# Patient Record
Sex: Female | Born: 1940 | Race: White | Hispanic: No | Smoking: Former smoker
Health system: Southern US, Community
[De-identification: ages and names within clinical notes are randomized; demographics above are authoritative.]

## PROBLEM LIST (undated history)

## (undated) ENCOUNTER — Emergency Department (HOSPITAL_COMMUNITY): Admission: EM

## (undated) DIAGNOSIS — S62109A Fracture of unspecified carpal bone, unspecified wrist, initial encounter for closed fracture: Secondary | ICD-10-CM

## (undated) DIAGNOSIS — M1990 Unspecified osteoarthritis, unspecified site: Secondary | ICD-10-CM

## (undated) DIAGNOSIS — I10 Essential (primary) hypertension: Secondary | ICD-10-CM

## (undated) DIAGNOSIS — M069 Rheumatoid arthritis, unspecified: Secondary | ICD-10-CM

## (undated) DIAGNOSIS — E785 Hyperlipidemia, unspecified: Secondary | ICD-10-CM

## (undated) DIAGNOSIS — I4891 Unspecified atrial fibrillation: Secondary | ICD-10-CM

## (undated) DIAGNOSIS — F0280 Dementia in other diseases classified elsewhere without behavioral disturbance: Secondary | ICD-10-CM

## (undated) HISTORY — PX: CHOLECYSTECTOMY: SHX55

## (undated) HISTORY — DX: Rheumatoid arthritis, unspecified: M06.9

## (undated) HISTORY — DX: Dementia in other diseases classified elsewhere, unspecified severity, without behavioral disturbance, psychotic disturbance, mood disturbance, and anxiety: F02.80

## (undated) HISTORY — DX: Unspecified osteoarthritis, unspecified site: M19.90

## (undated) HISTORY — DX: Hyperlipidemia, unspecified: E78.5

## (undated) HISTORY — DX: Fracture of unspecified carpal bone, unspecified wrist, initial encounter for closed fracture: S62.109A

## (undated) HISTORY — DX: Unspecified atrial fibrillation: I48.91

## (undated) HISTORY — PX: APPENDECTOMY: SHX54

## (undated) HISTORY — PX: CATARACT EXTRACTION: SUR2

## (undated) HISTORY — PX: HYSTERECTOMY ABDOMINAL WITH SALPINGECTOMY: SHX6725

## (undated) HISTORY — DX: Essential (primary) hypertension: I10

---

## 2018-09-01 ENCOUNTER — Other Ambulatory Visit: Payer: Self-pay

## 2018-09-02 ENCOUNTER — Other Ambulatory Visit: Payer: Self-pay | Admitting: Cardiology

## 2018-11-24 ENCOUNTER — Telehealth: Payer: Self-pay

## 2018-11-24 NOTE — Telephone Encounter (Signed)
Her PCP needs to fill the Rx for 1 month and I will be happy to see the patient in 3 weeks unless having problems then sooner

## 2018-11-24 NOTE — Telephone Encounter (Signed)
I received a phone call from a Physician assistant today doing a virtual visit with this patient . The Pa said the patient os needing refills on her meds,   Patient has not been seen since 2018. She is willing to do a virtual visit, can we send her Metoprolol, spirolactone and simvastatin in and schedule her for Virtual ?  Also, she said she has not taken these meds for over 3 months?

## 2018-12-07 ENCOUNTER — Encounter: Payer: Self-pay | Admitting: Cardiology

## 2018-12-08 ENCOUNTER — Other Ambulatory Visit: Payer: Self-pay

## 2018-12-08 ENCOUNTER — Encounter: Payer: Self-pay | Admitting: Cardiology

## 2018-12-08 ENCOUNTER — Ambulatory Visit (INDEPENDENT_AMBULATORY_CARE_PROVIDER_SITE_OTHER): Payer: Medicare Other | Admitting: Cardiology

## 2018-12-08 VITALS — Ht 65.0 in | Wt 166.0 lb

## 2018-12-08 DIAGNOSIS — I10 Essential (primary) hypertension: Secondary | ICD-10-CM

## 2018-12-08 DIAGNOSIS — I480 Paroxysmal atrial fibrillation: Secondary | ICD-10-CM | POA: Diagnosis not present

## 2018-12-08 NOTE — Progress Notes (Signed)
Virtual Visit via Video Note   Subjective:   Patricia Clark, female    DOB: 1941/04/21, 78 y.o.   MRN: [id number]   I connected with the patient on 12/08/18 by a video enabled telemedicine application and verified that I am speaking with the correct person using two identifiers.     I discussed the limitations of evaluation and management by telemedicine and the availability of in person appointments. The patient expressed understanding and agreed to proceed.   This visit type was conducted due to national recommendations for restrictions regarding the COVID-19 Pandemic (e.g. social distancing).  This format is felt to be most appropriate for this patient at this time.  All issues noted in this document were discussed and addressed.  No physical exam was performed (except for noted visual exam findings with Tele health visits).  The patient has consented to conduct a Tele health visit and understands insurance will be billed.     Chief complaint:  PAF   HPI  78 y.o.  female with hypertension, RA, PAF.  Patient recently moved into a new appt with her grandson, and has not been checking her BP regularly. She denies chest pain, shortness of breath, palpitations, leg edema, orthopnea, PND, TIA/syncope. She recently underwent labwork through Rheum Dr. Nickola Major. She has an upcoming visit with PCP Dr. Abigail Miyamoto next week.    Past Medical History:  Diagnosis Date  . A-fib (HCC)   . Hyperlipidemia   . Hypertension   . Osteoarthritis   . Rheumatoid arthritis [facility])      Past Surgical History:  Procedure Laterality Date  . APPENDECTOMY    . CATARACT EXTRACTION     BOTH EYES  . CHOLECYSTECTOMY    . HYSTERECTOMY ABDOMINAL WITH SALPINGECTOMY       Social History   Socioeconomic History  . Marital status: Divorced    Spouse name: Not on file  . Number of children: 2  . Years of education: Not on file  . Highest education level: Not on file  Occupational History  . Not on file   Social Needs  . Financial resource strain: Not on file  . Food insecurity:    Worry: Not on file    Inability: Not on file  . Transportation needs:    Medical: Not on file    Non-medical: Not on file  Tobacco Use  . Smoking status: Never Smoker  . Smokeless tobacco: Never Used  Substance and Sexual Activity  . Alcohol use: Never    Frequency: Never  . Drug use: Never  . Sexual activity: Not on file  Lifestyle  . Physical activity:    Days per week: Not on file    Minutes per session: Not on file  . Stress: Not on file  Relationships  . Social connections:    Talks on phone: Not on file    Gets together: Not on file    Attends religious service: Not on file    Active member of club or organization: Not on file    Attends meetings of clubs or organizations: Not on file    Relationship status: Not on file  . Intimate partner violence:    Fear of current or ex partner: Not on file    Emotionally abused: Not on file    Physically abused: Not on file    Forced sexual activity: Not on file  Other Topics Concern  . Not on file  Social History Narrative  . Not on file  Family History  Problem Relation Age of Onset  . COPD Mother   . Dementia Mother   . Heart attack Father      Current Outpatient Medications on File Prior to Visit  Medication Sig Dispense Refill  . folic acid (FOLVITE) 1 MG tablet Take 1 mg by mouth daily.    . methotrexate 2.5 MG tablet Take 10 tablets by mouth once a week.    . metoprolol succinate (TOPROL-XL) 50 MG 24 hr tablet Take 50 mg by mouth daily. Take with or immediately following a meal.    . oxyCODONE-acetaminophen (PERCOCET/ROXICET) 5-325 MG tablet Take 1 tablet by mouth every 12 (twelve) hours as needed.    Marland Kitchen POTASSIUM CHLORIDE PO Take by mouth daily.    . pramipexole (MIRAPEX) 0.125 MG tablet Take 0.25 mg by mouth daily.    . rivaroxaban (XARELTO) 20 MG TABS tablet Take 20 mg by mouth daily with supper.    . simvastatin (ZOCOR) 20  MG tablet Take 20 mg by mouth daily.    Marland Kitchen spironolactone (ALDACTONE) 25 MG tablet Take 25 mg by mouth daily.     No current facility-administered medications on file prior to visit.     Cardiovascular studies:  EKG 05/13/2017: Sinus rhythm 75 bpm. Frequent PAC's and atrial couplets.   Echocardiogram 05/18/2017: Left ventricle cavity is normal in size. Mild decrease in global wall motion. Doppler evidence of grade I (impaired) diastolic dysfunction, normal LAP. LVEF 45-50%. Left atrial cavity is mildly dilated. Mild Mitral (Grade I) regurgitation. Mild tricuspid regurgitation. Estimated pulmonary artery systolic pressure 25 mmHg No significant change compared to prior study dated 05/05/2016.  Event monitor 04/14/2017-06/12/2017: Paroxysmal atrial fibrillation.  Maximum heart rate 124 bpm.  Recent labs: Pending   Review of Systems  Constitution: Negative for decreased appetite, malaise/fatigue, weight gain and weight loss.  HENT: Negative for congestion.   Eyes: Negative for visual disturbance.  Cardiovascular: Negative for chest pain, dyspnea on exertion, leg swelling, palpitations and syncope.  Respiratory: Negative for cough.   Endocrine: Negative for cold intolerance.  Hematologic/Lymphatic: Does not bruise/bleed easily.  Skin: Negative for itching and rash.  Musculoskeletal: Negative for myalgias.  Gastrointestinal: Negative for abdominal pain, nausea and vomiting.  Genitourinary: Negative for dysuria.  Neurological: Negative for dizziness and weakness.  Psychiatric/Behavioral: The patient is not nervous/anxious.   All other systems reviewed and are negative.      Vitals not available.   Observation/findings during video visit   Objective:    Physical Exam  Constitutional: She is oriented to person, place, and time. She appears well-developed and well-nourished. No distress.  Pulmonary/Chest: Effort normal.  Neurological: She is alert and oriented to person,  place, and time.  Psychiatric: She has a normal mood and affect.  Nursing note and vitals reviewed.         Assessment & Recommendations:   78 y.o.  female with hypertension, RA, PAF.  PAF: CHA2DS2VASc score 4. Annual stroke risk 4%. Continue Xarelto 20 mg daily. Will obtain recent labs from Rheumatology office. Need to ensure GFR is still >50.  Hypertension: Needs follow up BP check. Has visit with PCP next week.  I will see her in 3 months   Keya Wynes Emiliano Dyer, MD [facility] [place] Pager: [phone] Office: [phone] If no answer Cell [phone]

## 2018-12-27 ENCOUNTER — Telehealth: Payer: Self-pay | Admitting: Cardiology

## 2018-12-27 NOTE — Telephone Encounter (Signed)
Patient called saying you wanted to know her BP reading. It is 126/87.

## 2018-12-28 NOTE — Telephone Encounter (Signed)
BP looks good. No further labs needed. Please ask if she needs refills.  Thanks MJP

## 2018-12-30 ENCOUNTER — Other Ambulatory Visit: Payer: Self-pay | Admitting: Cardiology

## 2018-12-30 DIAGNOSIS — E782 Mixed hyperlipidemia: Secondary | ICD-10-CM

## 2018-12-30 DIAGNOSIS — I10 Essential (primary) hypertension: Secondary | ICD-10-CM

## 2018-12-30 MED ORDER — SIMVASTATIN 20 MG PO TABS: 20.0000 mg | ORAL_TABLET | Freq: Every day | ORAL | 3 refills | Status: DC

## 2018-12-30 MED ORDER — SPIRONOLACTONE 25 MG PO TABS: 25.0000 mg | ORAL_TABLET | Freq: Every day | ORAL | 3 refills | Status: DC

## 2018-12-30 MED ORDER — METOPROLOL SUCCINATE ER 50 MG PO TB24: 50.0000 mg | ORAL_TABLET | Freq: Every day | ORAL | 3 refills | Status: DC

## 2019-01-27 ENCOUNTER — Other Ambulatory Visit: Payer: Self-pay

## 2019-01-27 MED ORDER — RIVAROXABAN 20 MG PO TABS: 20.0000 mg | ORAL_TABLET | Freq: Every day | ORAL | 0 refills | Status: DC

## 2019-03-08 ENCOUNTER — Encounter: Payer: Self-pay | Admitting: Cardiology

## 2019-03-09 ENCOUNTER — Ambulatory Visit: Payer: Medicare Other | Admitting: Cardiology

## 2019-03-26 NOTE — Progress Notes (Signed)
No show

## 2019-03-27 ENCOUNTER — Ambulatory Visit: Payer: Medicare Other | Admitting: Cardiology

## 2019-05-17 ENCOUNTER — Other Ambulatory Visit: Payer: Self-pay

## 2019-05-17 MED ORDER — RIVAROXABAN 20 MG PO TABS: 20.0000 mg | ORAL_TABLET | Freq: Every day | ORAL | 1 refills | Status: DC

## 2019-06-25 NOTE — Progress Notes (Signed)
Subjective:   Patricia Clark, female    DOB: 1940-08-23, 78 y.o.   MRN: [id number]   Chief complaint:  PAF   HPI  78 y.o.  female with hypertension, RA, PAF.  Patient reports being in a lot of pain owing to her rheumatoid arthritis. She has no cardiac complaints today. She denies any bleeding.   Past Medical History:  Diagnosis Date  . A-fib ([place])   . Hyperlipidemia   . Hypertension   . Osteoarthritis   . Rheumatoid arthritis [facility])      Past Surgical History:  Procedure Laterality Date  . APPENDECTOMY    . CATARACT EXTRACTION     BOTH EYES  . CHOLECYSTECTOMY    . HYSTERECTOMY ABDOMINAL WITH SALPINGECTOMY       Social History   Socioeconomic History  . Marital status: Divorced    Spouse name: Not on file  . Number of children: 2  . Years of education: Not on file  . Highest education level: Not on file  Occupational History  . Not on file  Tobacco Use  . Smoking status: Former Smoker    Packs/day: 0.25    Years: 6.00    Pack years: 1.50    Quit date: 12/08/1998    Years since quitting: 20.5  . Smokeless tobacco: Never Used  Substance and Sexual Activity  . Alcohol use: Yes    Comment: occ  . Drug use: Never  . Sexual activity: Not on file  Other Topics Concern  . Not on file  Social History Narrative  . Not on file   Social Determinants of Health   Financial Resource Strain:   . Difficulty of Paying Living Expenses: Not on file  Food Insecurity:   . Worried About Charity fundraiser in the Last Year: Not on file  . Ran Out of Food in the Last Year: Not on file  Transportation Needs:   . Lack of Transportation (Medical): Not on file  . Lack of Transportation (Non-Medical): Not on file  Physical Activity:   . Days of Exercise per Week: Not on file  . Minutes of Exercise per Session: Not on file  Stress:   . Feeling of Stress : Not on file  Social Connections:   . Frequency of Communication with Friends and Family: Not on file  . Frequency of  Social Gatherings with Friends and Family: Not on file  . Attends Religious Services: Not on file  . Active Member of Clubs or Organizations: Not on file  . Attends Archivist Meetings: Not on file  . Marital Status: Not on file  Intimate Partner Violence:   . Fear of Current or Ex-Partner: Not on file  . Emotionally Abused: Not on file  . Physically Abused: Not on file  . Sexually Abused: Not on file     Family History  Problem Relation Age of Onset  . COPD Mother   . Dementia Mother   . Heart attack Father   . Heart disease Sister   . Heart disease Sister      Current Outpatient Medications on File Prior to Visit  Medication Sig Dispense Refill  . folic acid (FOLVITE) 1 MG tablet Take 1 mg by mouth daily.    . methotrexate 2.5 MG tablet Take 10 tablets by mouth once a week.    . metoprolol succinate (TOPROL-XL) 50 MG 24 hr tablet Take 1 tablet (50 mg total) by mouth daily. Take with or immediately following  a meal. 90 tablet 3  . oxyCODONE-acetaminophen (PERCOCET/ROXICET) 5-325 MG tablet Take 1 tablet by mouth every 12 (twelve) hours as needed.    . Potassium 99 MG TABS Take 99 mg by mouth daily.    . pramipexole (MIRAPEX) 0.125 MG tablet Take 0.125 mg by mouth 2 (two) times daily.     . rivaroxaban (XARELTO) 20 MG TABS tablet Take 1 tablet (20 mg total) by mouth daily with supper. 90 tablet 1  . simvastatin (ZOCOR) 20 MG tablet Take 1 tablet (20 mg total) by mouth daily. 90 tablet 3  . spironolactone (ALDACTONE) 25 MG tablet Take 1 tablet (25 mg total) by mouth daily. 90 tablet 3   No current facility-administered medications on file prior to visit.    Cardiovascular studies:  Sinus rhythm 63 bpm. Occasional PAC. Nonspecific ST-T changes.  EKG 05/13/2017: Sinus rhythm 75 bpm. Frequent PAC's and atrial couplets.   Echocardiogram 05/18/2017: Left ventricle cavity is normal in size. Mild decrease in global wall motion. Doppler evidence of grade I (impaired)  diastolic dysfunction, normal LAP. LVEF 45-50%. Left atrial cavity is mildly dilated. Mild Mitral (Grade I) regurgitation. Mild tricuspid regurgitation. Estimated pulmonary artery systolic pressure 25 mmHg No significant change compared to prior study dated 05/05/2016.  Event monitor 04/14/2017-06/12/2017: Paroxysmal atrial fibrillation.  Maximum heart rate 124 bpm.  Recent labs: 11/03/2018: Glucose 88, BUN/Cr 11/0.62. EGFR normal. Na/K 141/3.9. Rest of the CMP normal H/H 11.2/32.3. MCV 93. Platelets 424    Review of Systems  Constitution: Negative for decreased appetite, malaise/fatigue, weight gain and weight loss.  HENT: Negative for congestion.   Eyes: Negative for visual disturbance.  Cardiovascular: Negative for chest pain, dyspnea on exertion, leg swelling, palpitations and syncope.  Respiratory: Negative for cough.   Endocrine: Negative for cold intolerance.  Hematologic/Lymphatic: Does not bruise/bleed easily.  Skin: Negative for itching and rash.  Musculoskeletal: Negative for myalgias.  Gastrointestinal: Negative for abdominal pain, nausea and vomiting.  Genitourinary: Negative for dysuria.  Neurological: Negative for dizziness and weakness.  Psychiatric/Behavioral: The patient is not nervous/anxious.   All other systems reviewed and are negative.      Vitals:   06/26/19 1440  BP: (!) 147/80  Pulse: 60  SpO2: 99%     Objective:    Physical Exam  Constitutional: She is oriented to person, place, and time. She appears well-developed and well-nourished. No distress.  Pulmonary/Chest: Effort normal.  Neurological: She is alert and oriented to person, place, and time.  Psychiatric: She has a normal mood and affect.  Nursing note and vitals reviewed.         Assessment & Recommendations:   78 y.o.  female with hypertension, RA, PAF.  PAF: CHA2DS2VASc score 4. Annual stroke risk 4%. Continue Xarelto 20 mg daily.  Hypertension: Mildly elevated .  Continue f/u w/PCP.   F/u in 1 year.  Nigel Mormon, MD [facility]. [place] Pager: [phone] Office: [phone] If no answer Cell [phone]

## 2019-06-26 ENCOUNTER — Other Ambulatory Visit: Payer: Self-pay

## 2019-06-26 ENCOUNTER — Encounter: Payer: Self-pay | Admitting: Cardiology

## 2019-06-26 ENCOUNTER — Ambulatory Visit: Payer: Medicare Other | Admitting: Cardiology

## 2019-06-26 VITALS — BP 147/80 | HR 60 | Ht 64.0 in | Wt 173.1 lb

## 2019-06-26 DIAGNOSIS — I480 Paroxysmal atrial fibrillation: Secondary | ICD-10-CM | POA: Diagnosis not present

## 2019-06-26 DIAGNOSIS — I10 Essential (primary) hypertension: Secondary | ICD-10-CM | POA: Diagnosis not present

## 2019-11-22 ENCOUNTER — Other Ambulatory Visit: Payer: Self-pay | Admitting: Cardiology

## 2019-12-20 ENCOUNTER — Other Ambulatory Visit: Payer: Self-pay

## 2019-12-20 DIAGNOSIS — E782 Mixed hyperlipidemia: Secondary | ICD-10-CM

## 2019-12-20 DIAGNOSIS — I10 Essential (primary) hypertension: Secondary | ICD-10-CM

## 2019-12-20 MED ORDER — METOPROLOL SUCCINATE ER 50 MG PO TB24: 50.0000 mg | ORAL_TABLET | Freq: Every day | ORAL | 3 refills | Status: DC

## 2019-12-20 MED ORDER — SIMVASTATIN 20 MG PO TABS: 20.0000 mg | ORAL_TABLET | Freq: Every day | ORAL | 3 refills | Status: DC

## 2019-12-20 MED ORDER — SPIRONOLACTONE 25 MG PO TABS: 25.0000 mg | ORAL_TABLET | Freq: Every day | ORAL | 3 refills | Status: DC

## 2020-02-16 ENCOUNTER — Other Ambulatory Visit: Payer: Self-pay | Admitting: Family Medicine

## 2020-02-16 DIAGNOSIS — E2839 Other primary ovarian failure: Secondary | ICD-10-CM

## 2020-04-12 ENCOUNTER — Encounter (HOSPITAL_COMMUNITY): Payer: Self-pay | Admitting: Emergency Medicine

## 2020-04-12 ENCOUNTER — Emergency Department (HOSPITAL_COMMUNITY)
Admission: EM | Admit: 2020-04-12 | Discharge: 2020-04-12 | Disposition: A | Discharge status: Home / Self Care | Payer: Medicare Other | Attending: Emergency Medicine | Admitting: Emergency Medicine

## 2020-04-12 ENCOUNTER — Other Ambulatory Visit: Payer: Self-pay

## 2020-04-12 ENCOUNTER — Emergency Department (HOSPITAL_COMMUNITY): Payer: Medicare Other

## 2020-04-12 DIAGNOSIS — I10 Essential (primary) hypertension: Secondary | ICD-10-CM | POA: Diagnosis not present

## 2020-04-12 DIAGNOSIS — Z79899 Other long term (current) drug therapy: Secondary | ICD-10-CM | POA: Diagnosis not present

## 2020-04-12 DIAGNOSIS — F05 Delirium due to known physiological condition: Secondary | ICD-10-CM | POA: Diagnosis not present

## 2020-04-12 DIAGNOSIS — N390 Urinary tract infection, site not specified: Secondary | ICD-10-CM | POA: Diagnosis not present

## 2020-04-12 DIAGNOSIS — Z7901 Long term (current) use of anticoagulants: Secondary | ICD-10-CM | POA: Insufficient documentation

## 2020-04-12 DIAGNOSIS — R4182 Altered mental status, unspecified: Secondary | ICD-10-CM | POA: Diagnosis present

## 2020-04-12 DIAGNOSIS — Z87891 Personal history of nicotine dependence: Secondary | ICD-10-CM | POA: Diagnosis not present

## 2020-04-12 DIAGNOSIS — R410 Disorientation, unspecified: Secondary | ICD-10-CM

## 2020-04-12 LAB — CBC WITH DIFFERENTIAL/PLATELET
Abs Immature Granulocytes: 0.03 10*3/uL (ref 0.00–0.07)
Basophils Absolute: 0.1 10*3/uL (ref 0.0–0.1)
Basophils Relative: 2 %
Eosinophils Absolute: 0.6 10*3/uL — ABNORMAL HIGH (ref 0.0–0.5)
Eosinophils Relative: 10 %
HCT: 36.3 % (ref 36.0–46.0)
Hemoglobin: 11.5 g/dL — ABNORMAL LOW (ref 12.0–15.0)
Immature Granulocytes: 1 %
Lymphocytes Relative: 27 %
Lymphs Abs: 1.7 10*3/uL (ref 0.7–4.0)
MCH: 31.9 pg (ref 26.0–34.0)
MCHC: 31.7 g/dL (ref 30.0–36.0)
MCV: 100.6 fL — ABNORMAL HIGH (ref 80.0–100.0)
Monocytes Absolute: 0.6 10*3/uL (ref 0.1–1.0)
Monocytes Relative: 10 %
Neutro Abs: 3.1 10*3/uL (ref 1.7–7.7)
Neutrophils Relative %: 50 %
Platelets: 437 10*3/uL — ABNORMAL HIGH (ref 150–400)
RBC: 3.61 MIL/uL — ABNORMAL LOW (ref 3.87–5.11)
RDW: 16.2 % — ABNORMAL HIGH (ref 11.5–15.5)
WBC: 6.1 10*3/uL (ref 4.0–10.5)
nRBC: 0 % (ref 0.0–0.2)

## 2020-04-12 LAB — URINALYSIS, ROUTINE W REFLEX MICROSCOPIC
Bilirubin Urine: NEGATIVE
Glucose, UA: NEGATIVE mg/dL
Ketones, ur: NEGATIVE mg/dL
Nitrite: POSITIVE — AB
Protein, ur: NEGATIVE mg/dL
Specific Gravity, Urine: 1.008 (ref 1.005–1.030)
pH: 7 (ref 5.0–8.0)

## 2020-04-12 LAB — COMPREHENSIVE METABOLIC PANEL
ALT: 11 U/L (ref 0–44)
AST: 21 U/L (ref 15–41)
Albumin: 4 g/dL (ref 3.5–5.0)
Alkaline Phosphatase: 91 U/L (ref 38–126)
Anion gap: 11 (ref 5–15)
BUN: 5 mg/dL — ABNORMAL LOW (ref 8–23)
CO2: 25 mmol/L (ref 22–32)
Calcium: 9.7 mg/dL (ref 8.9–10.3)
Chloride: 103 mmol/L (ref 98–111)
Creatinine, Ser: 0.64 mg/dL (ref 0.44–1.00)
GFR, Estimated: >60 mL/min (ref >60)
Glucose, Bld: 133 mg/dL — ABNORMAL HIGH (ref 70–99)
Potassium: 3.2 mmol/L — ABNORMAL LOW (ref 3.5–5.1)
Sodium: 139 mmol/L (ref 135–145)
Total Bilirubin: 0.2 mg/dL — ABNORMAL LOW (ref 0.3–1.2)
Total Protein: 7.2 g/dL (ref 6.5–8.1)

## 2020-04-12 LAB — CBG MONITORING, ED: Glucose-Capillary: 160 mg/dL — ABNORMAL HIGH (ref 70–99)

## 2020-04-12 LAB — TROPONIN I (HIGH SENSITIVITY)
Troponin I (High Sensitivity): 14 ng/L (ref <18)
Troponin I (High Sensitivity): 15 ng/L (ref <18)

## 2020-04-12 MED ORDER — SPIRONOLACTONE 25 MG PO TABS: 25.0000 mg | ORAL_TABLET | Freq: Every day | ORAL | Status: DC

## 2020-04-12 MED ORDER — METOPROLOL SUCCINATE ER 25 MG PO TB24
50.0000 mg | ORAL_TABLET | Freq: Every day | ORAL | Status: DC
  Administered 2020-04-12: 50 mg via ORAL
  Filled 2020-04-12: qty 2

## 2020-04-12 MED ORDER — SODIUM CHLORIDE 0.9 % IV SOLN
1.0000 g | Freq: Once | INTRAVENOUS | Status: AC
  Administered 2020-04-12: 1 g via INTRAVENOUS
  Filled 2020-04-12: qty 10

## 2020-04-12 MED ORDER — POTASSIUM CHLORIDE CRYS ER 20 MEQ PO TBCR
40.0000 meq | EXTENDED_RELEASE_TABLET | Freq: Once | ORAL | Status: AC
  Administered 2020-04-12: 40 meq via ORAL
  Filled 2020-04-12: qty 2

## 2020-04-12 MED ORDER — PENICILLIN V POTASSIUM 500 MG PO TABS: 500.0000 mg | ORAL_TABLET | Freq: Three times a day (TID) | ORAL | 0 refills | Status: DC

## 2020-04-12 NOTE — ED Triage Notes (Signed)
Pt states she is having some confusion for the past week and she wants to be check. She states that family reported her that she is walking at night and wandering around in the rain and doing thinks that she don't remember, pt is AO x 4 during triage, denies any pain, some tremors noticed in her hand, but no neuro deficit noticed.

## 2020-04-12 NOTE — ED Provider Notes (Signed)
[facility] EMERGENCY DEPARTMENT Provider Note   CSN: [id number] Arrival date & time: 04/12/20  1329     History Chief Complaint  Patient presents with  . Altered Mental Status    Patricia Clark is a 79 y.o. female.  The history is provided by the patient, a relative and medical records. No language interpreter was used.  Altered Mental Status    79 year old female significant history of atrial fibrillation currently on Xarelto, hypertension, hyperlipidemia presenting to the ED from home for evaluation of altered mental status.  Patient states family member that she lives with which includes her grandson and granddaughter who voiced concern that she has been more confused within the past week.  One example is patient was found living in the house at nighttime in the rain that she did not recall.  She also is more forgetful within the past few days.  Patient states she felt fine and does not think that she is having any memory problem.  She is here at the recommendation of her family member who apparently have reached out to her PCP who recommend patient to come here for "a full checkup".  Patient does not have any specific complaint aside from some mild congestion for the past few days and occasional cough.  She is fully vaccinated for COVID-19.  She denies any recent medication changes, denies feeling depressed, denies alcohol or drug use and denies any change in her diet and sleeping habit.  She is able to perform her daily functional activity and able to ambulate herself.  She mentioned having history of low potassium in the past.  She denies any history of dementia.  Additional history obtained through granddaughter who is now at bedside.  Granddaughter reported patient is apparently more confused than usual and would like to have her checkup.  Patient denies any infectious symptoms including no urinary symptoms.  No chest pain or shortness of breath.  Past Medical History:    Diagnosis Date  . A-fib (HCC)   . Hyperlipidemia   . Hypertension   . Osteoarthritis   . Rheumatoid arthritis [facility])     Patient Active Problem List   Diagnosis Date Noted  . Paroxysmal atrial fibrillation (HCC) 12/08/2018  . Essential hypertension 12/08/2018    Past Surgical History:  Procedure Laterality Date  . APPENDECTOMY    . CATARACT EXTRACTION     BOTH EYES  . CHOLECYSTECTOMY    . HYSTERECTOMY ABDOMINAL WITH SALPINGECTOMY       OB History   No obstetric history on file.     Family History  Problem Relation Age of Onset  . COPD Mother   . Dementia Mother   . Heart attack Father   . Heart disease Sister   . Heart disease Sister     Social History   Tobacco Use  . Smoking status: Former Smoker    Packs/day: 0.25    Years: 6.00    Pack years: 1.50    Quit date: 12/08/1998    Years since quitting: 21.3  . Smokeless tobacco: Never Used  Vaping Use  . Vaping Use: Never used  Substance Use Topics  . Alcohol use: Yes    Comment: occ  . Drug use: Never    Home Medications Prior to Admission medications   Medication Sig Start Date End Date Taking? Authorizing Provider  folic acid (FOLVITE) 1 MG tablet Take 1 mg by mouth daily.    [provider]  methotrexate 2.5 MG  tablet Take 10 tablets by mouth once a week.    [provider]  metoprolol succinate (TOPROL-XL) 50 MG 24 hr tablet Take 1 tablet (50 mg total) by mouth daily. Take with or immediately following a meal. 12/20/19   Patwardhan, Manish J, MD  oxyCODONE-acetaminophen (PERCOCET/ROXICET) 5-325 MG tablet Take 1 tablet by mouth every 12 (twelve) hours as needed. 08/03/18   [provider]  Potassium 99 MG TABS Take 99 mg by mouth daily.    [provider]  pramipexole (MIRAPEX) 0.125 MG tablet Take 0.125 mg by mouth 2 (two) times daily.  08/04/18   [provider]  simvastatin (ZOCOR) 20 MG tablet Take 1 tablet (20 mg total) by mouth daily. 12/20/19    Patwardhan, Anabel Bene, MD  spironolactone (ALDACTONE) 25 MG tablet Take 1 tablet (25 mg total) by mouth daily. 12/20/19   Patwardhan, Manish J, MD  XARELTO 20 MG TABS tablet TAKE 1 TABLET (20 MG TOTAL) BY MOUTH DAILY WITH SUPPER. 11/22/19   Yates Decamp, MD    Allergies    Patient has no known allergies.  Review of Systems   Review of Systems  All other systems reviewed and are negative.   Physical Exam Updated Vital Signs BP (!) 153/81   Pulse 66   Temp 98.2 F (36.8 C)   Resp 15   Ht 5\' 4"  (1.626 m)   Wt 78.5 kg   SpO2 98%   BMI 29.71 kg/m   Physical Exam Vitals and nursing note reviewed.  Constitutional:      General: She is not in acute distress.    Appearance: She is well-developed.  HENT:     Head: Atraumatic.  Eyes:     Conjunctiva/sclera: Conjunctivae normal.  Cardiovascular:     Rate and Rhythm: Normal rate. Rhythm irregular.     Pulses: Normal pulses.     Heart sounds: Normal heart sounds.  Pulmonary:     Effort: Pulmonary effort is normal.     Breath sounds: No wheezing, rhonchi or rales.  Abdominal:     Palpations: Abdomen is soft.     Tenderness: There is no abdominal tenderness.  Musculoskeletal:     Cervical back: Neck supple.  Skin:    Findings: No rash.  Neurological:     Mental Status: She is alert and oriented to person, place, and time.     GCS: GCS eye subscore is 4. GCS verbal subscore is 5. GCS motor subscore is 6.     Cranial Nerves: Cranial nerves are intact.     Sensory: Sensation is intact.     Motor: Motor function is intact.     Comments: Normal neuro status.  Normal mentation.  Able to perform 3 words recall after 10 minutes.  Psychiatric:        Mood and Affect: Mood normal.        Speech: Speech normal.        Behavior: Behavior is cooperative.        Cognition and Memory: Cognition and memory normal.     ED Results / Procedures / Treatments   Labs (all labs ordered are listed, but only abnormal results are displayed) Labs  Reviewed  CBC WITH DIFFERENTIAL/PLATELET - Abnormal; Notable for the following components:      Result Value   RBC 3.61 (*)    Hemoglobin 11.5 (*)    MCV 100.6 (*)    RDW 16.2 (*)    Platelets 437 (*)  Eosinophils Absolute 0.6 (*)    All other components within normal limits  COMPREHENSIVE METABOLIC PANEL - Abnormal; Notable for the following components:   Potassium 3.2 (*)    Glucose, Bld 133 (*)    BUN 5 (*)    Total Bilirubin 0.2 (*)    All other components within normal limits  URINALYSIS, ROUTINE W REFLEX MICROSCOPIC - Abnormal; Notable for the following components:   APPearance HAZY (*)    Hgb urine dipstick SMALL (*)    Nitrite POSITIVE (*)    Leukocytes,Ua LARGE (*)    Bacteria, UA RARE (*)    All other components within normal limits  CBG MONITORING, ED - Abnormal; Notable for the following components:   Glucose-Capillary 160 (*)    All other components within normal limits  URINE CULTURE  TROPONIN I (HIGH SENSITIVITY)  TROPONIN I (HIGH SENSITIVITY)    EKG EKG Interpretation  Date/Time:  Friday April 12 2020 13:39:42 EDT Ventricular Rate:  75 PR Interval:    QRS Duration: 90 QT Interval:  444 QTC Calculation: 495 R Axis:   20 Text Interpretation: Sinus rhythm with Blocked Premature atrial complexes Nonspecific ST and T wave abnormality Prolonged QT Abnormal ECG Confirmed by Marianna Fuss ([id number]) on 04/12/2020 4:43:32 PM   Radiology CT Head Wo Contrast  Result Date: 04/12/2020 CLINICAL DATA:  Confusion EXAM: CT HEAD WITHOUT CONTRAST TECHNIQUE: Contiguous axial images were obtained from the base of the skull through the vertex without intravenous contrast. COMPARISON:  None. FINDINGS: Brain: No evidence of acute infarction, hemorrhage, hydrocephalus, extra-axial collection or mass lesion/mass effect. Mild atrophic changes are noted. Vascular: No hyperdense vessel or unexpected calcification. Skull: Normal. Negative for fracture or focal lesion.  Sinuses/Orbits: No acute finding. Other: None. IMPRESSION: No acute intracranial abnormality noted. Mild atrophic changes are seen. Electronically Signed   By: Alcide Clever M.D.   On: 04/12/2020 19:13    Procedures Procedures (including critical care time)  Medications Ordered in ED Medications  metoprolol succinate (TOPROL-XL) 24 hr tablet 50 mg (has no administration in time range)  spironolactone (ALDACTONE) tablet 25 mg (has no administration in time range)  cefTRIAXone (ROCEPHIN) 1 g in sodium chloride 0.9 % 100 mL IVPB (has no administration in time range)  potassium chloride SA (KLOR-CON) CR tablet 40 mEq (40 mEq Oral Given 04/12/20 1753)    ED Course  I have reviewed the triage vital signs and the nursing notes.  Pertinent labs & imaging results that were available during my care of the patient were reviewed by me and considered in my medical decision making (see chart for details).    MDM Rules/Calculators/A&P                          BP (!) 149/102   Pulse 78   Temp 98.2 F (36.8 C)   Resp (!) 21   Ht 5\' 4"  (1.626 m)   Wt 78.5 kg   SpO2 100%   BMI 29.71 kg/m   Final Clinical Impression(s) / ED Diagnoses Final diagnoses:  Lower urinary tract infectious disease  Delirium    Rx / DC Orders ED Discharge Orders         Ordered    penicillin v potassium (VEETID) 500 MG tablet  3 times daily        04/12/20 1946         5:08 PM Patient brought here with concerns of increased confusion within the past week.  Patient states she felt fine. family voice concern that she is more confused than usual.  On exam she has no focal neuro deficit, mentating appropriately, is alert and oriented x3 and able to recall 3 words after 10 minutes.  Aside from mild congestion and occasional cough patient without any infectious symptoms.  Will check UA.  Work-up fairly unremarkable.  Mild hypokalemia with a potassium of 3.2, supplementation given.  Care discussed with Dr. Stevie Kern.     6:17 PM UA came back with finding concerning for UTI.  Therefore, will give Rocephin, urine culture sent.  7:45 PM At this time, patient is stable for discharge.  She can take p.o. medication for UTI.  She will follow-up with PCP for further care.  Return precaution discussed.  Patient and family member agrees with plan.   Fayrene Helper, PA-C 04/12/20 1947    Milagros Loll, MD 04/13/20 [phone]

## 2020-04-12 NOTE — Discharge Instructions (Signed)
Your confusion is likely due to an underlying urinary tract infection.  Please take antibiotic as prescribed and follow-up with your doctor for further care.  Return if you have any concern.

## 2020-04-15 LAB — URINE CULTURE: Culture: >=100000 — AB

## 2020-04-16 ENCOUNTER — Telehealth: Payer: Self-pay | Admitting: *Deleted

## 2020-04-16 NOTE — Telephone Encounter (Signed)
Post ED Visit - Positive Culture Follow-up  Culture report reviewed by antimicrobial stewardship pharmacist: [facility] []  , Pharm.D. []  Enzo Bi, Pharm.D., BCPS AQ-ID []  , Pharm.D., BCPS []  Celedonio Miyamoto, Pharm.D., BCPS []  [place], Garvin Fila.D., BCPS, AAHIVP []  , Pharm.D., BCPS, AAHIVP []  Georgina Pillion, PharmD, BCPS []  , PharmD, BCPS []  [place], PharmD, BCPS []  [place], PharmD []  , PharmD, BCPS []  Estella Husk, PharmD  Pharmacy Team []  Lysle Pearl, PharmD []  , PharmD []  Phillips Climes, PharmD []  , Rph []  Agapito Games) , PharmD []  Verlan Friends, PharmD []  , PharmD []  Mervyn Gay, PharmD []  , PharmD []  Vinnie Level, PharmD []  [facility], PharmD []  , PharmD []  Len Childs, PharmD   Positive urine culture Treated with Penicillin V Potassium, organism sensitive to the same and no further patient follow-up is required at this time. , PharmD  Greer Pickerel Talley 04/16/2020, 11:26 AM

## 2020-04-16 NOTE — Progress Notes (Signed)
ED Antimicrobial Stewardship Positive Culture Follow Up   Patricia Clark is an 79 y.o. female who presented to [facility] on 04/12/2020 with a chief complaint of altered mental status  Chief Complaint  Patient presents with  . Altered Mental Status    Recent Results (from the past 720 hour(s))  Urine culture     Status: Abnormal   Collection Time: 04/12/20  5:56 PM   Specimen: Urine, Random  Result Value Ref Range Status   Specimen Description URINE, RANDOM  Final   Special Requests   Final    NONE Performed at [facility], [address] [place], [place] [address]    Culture >=100,000 COLONIES/mL ESCHERICHIA COLI (A)  Final   Report Status 04/15/2020 FINAL  Final   Organism ID, Bacteria ESCHERICHIA COLI (A)  Final      Susceptibility   Escherichia coli - MIC*    AMPICILLIN 8 SENSITIVE Sensitive     CEFAZOLIN <=4 SENSITIVE Sensitive     CEFTRIAXONE <=0.25 SENSITIVE Sensitive     CIPROFLOXACIN <=0.25 SENSITIVE Sensitive     GENTAMICIN <=1 SENSITIVE Sensitive     IMIPENEM <=0.25 SENSITIVE Sensitive     NITROFURANTOIN <=16 SENSITIVE Sensitive     TRIMETH/SULFA <=20 SENSITIVE Sensitive     AMPICILLIN/SULBACTAM <=2 SENSITIVE Sensitive     PIP/TAZO <=4 SENSITIVE Sensitive     * >=100,000 COLONIES/mL ESCHERICHIA COLI   AMS appeared to have resolved while in ED and patient denied urinary symptoms - likely asymptomatic bacteriuria and recommend no antibiotics.  MD in agreement that penicillin is not frequently used to treat UTI, however would like to continue course of therapy since patient has shown improvement.  ED Provider: Alvester Chou, MD  Rexford Maus, PharmD PGY-1 Acute Care Pharmacy Resident 04/16/2020 9:40 AM Monday - Friday phone -  [phone] Saturday - Sunday phone - [phone]

## 2020-04-24 ENCOUNTER — Other Ambulatory Visit: Payer: Self-pay

## 2020-04-24 MED ORDER — RIVAROXABAN 20 MG PO TABS: ORAL_TABLET | ORAL | 1 refills | Status: DC

## 2020-05-18 ENCOUNTER — Other Ambulatory Visit: Payer: Self-pay | Admitting: Cardiology

## 2020-05-22 ENCOUNTER — Ambulatory Visit
Admission: RE | Admit: 2020-05-22 | Discharge: 2020-05-22 | Disposition: A | Discharge status: Home / Self Care | Payer: Medicare Other | Source: Ambulatory Visit | Attending: Family Medicine | Admitting: Family Medicine

## 2020-05-22 ENCOUNTER — Other Ambulatory Visit: Payer: Self-pay

## 2020-05-22 DIAGNOSIS — E2839 Other primary ovarian failure: Secondary | ICD-10-CM

## 2020-06-21 ENCOUNTER — Ambulatory Visit: Payer: Medicare Other | Admitting: Cardiology

## 2020-06-24 ENCOUNTER — Other Ambulatory Visit: Payer: Self-pay | Admitting: Cardiology

## 2020-06-24 DIAGNOSIS — E782 Mixed hyperlipidemia: Secondary | ICD-10-CM

## 2020-06-24 DIAGNOSIS — I10 Essential (primary) hypertension: Secondary | ICD-10-CM

## 2020-07-17 ENCOUNTER — Other Ambulatory Visit: Payer: Self-pay

## 2020-07-17 ENCOUNTER — Ambulatory Visit: Payer: Medicare Other | Admitting: Cardiology

## 2020-07-17 ENCOUNTER — Encounter: Payer: Self-pay | Admitting: Cardiology

## 2020-07-17 VITALS — BP 138/87 | HR 68 | Ht 64.0 in | Wt 173.0 lb

## 2020-07-17 DIAGNOSIS — I10 Essential (primary) hypertension: Secondary | ICD-10-CM | POA: Diagnosis not present

## 2020-07-17 DIAGNOSIS — I480 Paroxysmal atrial fibrillation: Secondary | ICD-10-CM

## 2020-07-17 MED ORDER — POTASSIUM 99 MG PO TABS
99.0000 mg | ORAL_TABLET | Freq: Every day | ORAL | 0 refills | Status: DC
Start: 2020-07-17 — End: 2023-09-16

## 2020-07-17 NOTE — Progress Notes (Signed)
Subjective:   Patricia Clark, female    DOB: [birth year], 80 y.o.   MRN: [id number]   Chief complaint:  PAF   HPI  80 y.o.  female with hypertension, RA, PAF.  Patient had a mechanical fall leading to wrist fracture in 2022. She denies chest pain, shortness of breath, palpitations, leg edema, orthopnea, PND, TIA/syncope.   Current Outpatient Medications on File Prior to Visit  Medication Sig Dispense Refill  . folic acid (FOLVITE) 1 MG tablet Take 1 mg by mouth daily.    . methotrexate 2.5 MG tablet Take 10 tablets by mouth once a week.    . metoprolol succinate (TOPROL-XL) 50 MG 24 hr tablet TAKE 1 TABLET (50 MG TOTAL) BY MOUTH DAILY. TAKE WITH OR IMMEDIATELY FOLLOWING A MEAL. 90 tablet 3  . oxyCODONE-acetaminophen (PERCOCET/ROXICET) 5-325 MG tablet Take 1 tablet by mouth every 12 (twelve) hours as needed.    . penicillin v potassium (VEETID) 500 MG tablet Take 1 tablet (500 mg total) by mouth 3 (three) times daily. 30 tablet 0  . Potassium 99 MG TABS Take 99 mg by mouth daily.    . pramipexole (MIRAPEX) 0.125 MG tablet Take 0.125 mg by mouth 2 (two) times daily.     . simvastatin (ZOCOR) 20 MG tablet TAKE 1 TABLET BY MOUTH EVERY DAY 90 tablet 3  . spironolactone (ALDACTONE) 25 MG tablet TAKE 1 TABLET BY MOUTH EVERY DAY 90 tablet 3  . XARELTO 20 MG TABS tablet TAKE 1 TABLET (20 MG TOTAL) BY MOUTH DAILY WITH SUPPER. 90 tablet 1   No current facility-administered medications on file prior to visit.    Cardiovascular studies:  EKG 07/17/2020: Sinus rhythm 60 bpm Occasional PAC Nonspecific ST-T changes  Echocardiogram 05/18/2017: Left ventricle cavity is normal in size. Mild decrease in global wall motion. Doppler evidence of grade I (impaired) diastolic dysfunction, normal LAP. LVEF 45-50%. Left atrial cavity is mildly dilated. Mild Mitral (Grade I) regurgitation. Mild tricuspid regurgitation. Estimated pulmonary artery systolic pressure 25 mmHg No significant change compared  to prior study dated 05/05/2016.  Event monitor 04/14/2017-06/12/2017: Paroxysmal atrial fibrillation.  Maximum heart rate 124 bpm.  Recent labs: 04/12/2020: Glucose 133, BUN/Cr 5/0.64. EGFR >60. Na/K 139/3.2. Rest of the CMP normal H/H 11/36. MCV 100. Platelets 437  Results for MALLARIE, VOORHIES (MRN [id number]) as of 07/17/2020 12:54  Ref. Range 04/12/2020 13:50 04/12/2020 16:49  Troponin I (High Sensitivity) Latest Ref Range: <18 ng/L 14 15    11/03/2018: Glucose 88, BUN/Cr 11/0.62. EGFR normal. Na/K 141/3.9. Rest of the CMP normal H/H 11.2/32.3. MCV 93. Platelets 424    Review of Systems  Cardiovascular: Negative for chest pain, dyspnea on exertion, leg swelling, palpitations and syncope.       Vitals:   07/17/20 1422  BP: 138/87  Pulse: 68  SpO2: 98%     Objective:    Physical Exam Vitals and nursing note reviewed.  Constitutional:      General: She is not in acute distress. Neck:     Vascular: No JVD.  Cardiovascular:     Rate and Rhythm: Normal rate and regular rhythm.     Heart sounds: Normal heart sounds. No murmur heard.   Pulmonary:     Effort: Pulmonary effort is normal.     Breath sounds: Normal breath sounds. No wheezing or rales.           Assessment & Recommendations:   80 y.o.  female with hypertension, RA, PAF.  PAF: CHA2DS2VASc  score 4. Annual stroke risk 4%. Continue Xarelto 20 mg daily.  Hypertension: Controlled. She is on spirolactone, as well as potassium. Her K was 3.2 in 04/2019. Defer f/u w/PCP  F/u in 1 year.  Nigel Mormon, MD [facility] [place] Pager: [phone] Office: [phone] If no answer Cell [phone]

## 2020-08-01 DIAGNOSIS — M0579 Rheumatoid arthritis with rheumatoid factor of multiple sites without organ or systems involvement: Secondary | ICD-10-CM | POA: Diagnosis not present

## 2020-08-01 DIAGNOSIS — M150 Primary generalized (osteo)arthritis: Secondary | ICD-10-CM | POA: Diagnosis not present

## 2020-08-01 DIAGNOSIS — M25531 Pain in right wrist: Secondary | ICD-10-CM | POA: Diagnosis not present

## 2020-08-01 DIAGNOSIS — M2550 Pain in unspecified joint: Secondary | ICD-10-CM | POA: Diagnosis not present

## 2020-08-01 DIAGNOSIS — M5136 Other intervertebral disc degeneration, lumbar region: Secondary | ICD-10-CM | POA: Diagnosis not present

## 2020-08-01 DIAGNOSIS — Z79899 Other long term (current) drug therapy: Secondary | ICD-10-CM | POA: Diagnosis not present

## 2020-08-05 DIAGNOSIS — I10 Essential (primary) hypertension: Secondary | ICD-10-CM | POA: Diagnosis not present

## 2020-08-05 DIAGNOSIS — I480 Paroxysmal atrial fibrillation: Secondary | ICD-10-CM | POA: Diagnosis not present

## 2020-08-05 DIAGNOSIS — M0579 Rheumatoid arthritis with rheumatoid factor of multiple sites without organ or systems involvement: Secondary | ICD-10-CM | POA: Diagnosis not present

## 2020-08-05 DIAGNOSIS — E782 Mixed hyperlipidemia: Secondary | ICD-10-CM | POA: Diagnosis not present

## 2020-08-27 DIAGNOSIS — R7989 Other specified abnormal findings of blood chemistry: Secondary | ICD-10-CM | POA: Diagnosis not present

## 2020-09-23 DIAGNOSIS — E782 Mixed hyperlipidemia: Secondary | ICD-10-CM | POA: Diagnosis not present

## 2020-09-23 DIAGNOSIS — M0579 Rheumatoid arthritis with rheumatoid factor of multiple sites without organ or systems involvement: Secondary | ICD-10-CM | POA: Diagnosis not present

## 2020-09-23 DIAGNOSIS — I480 Paroxysmal atrial fibrillation: Secondary | ICD-10-CM | POA: Diagnosis not present

## 2020-09-23 DIAGNOSIS — I10 Essential (primary) hypertension: Secondary | ICD-10-CM | POA: Diagnosis not present

## 2020-10-07 DIAGNOSIS — Z9049 Acquired absence of other specified parts of digestive tract: Secondary | ICD-10-CM | POA: Diagnosis not present

## 2020-10-07 DIAGNOSIS — R7989 Other specified abnormal findings of blood chemistry: Secondary | ICD-10-CM | POA: Diagnosis not present

## 2020-10-29 DIAGNOSIS — M0579 Rheumatoid arthritis with rheumatoid factor of multiple sites without organ or systems involvement: Secondary | ICD-10-CM | POA: Diagnosis not present

## 2020-11-19 DIAGNOSIS — J40 Bronchitis, not specified as acute or chronic: Secondary | ICD-10-CM | POA: Diagnosis not present

## 2020-11-29 DIAGNOSIS — I10 Essential (primary) hypertension: Secondary | ICD-10-CM | POA: Diagnosis not present

## 2020-11-29 DIAGNOSIS — M0579 Rheumatoid arthritis with rheumatoid factor of multiple sites without organ or systems involvement: Secondary | ICD-10-CM | POA: Diagnosis not present

## 2020-11-29 DIAGNOSIS — I480 Paroxysmal atrial fibrillation: Secondary | ICD-10-CM | POA: Diagnosis not present

## 2020-11-29 DIAGNOSIS — E782 Mixed hyperlipidemia: Secondary | ICD-10-CM | POA: Diagnosis not present

## 2020-12-09 ENCOUNTER — Ambulatory Visit
Admission: EM | Admit: 2020-12-09 | Discharge: 2020-12-09 | Disposition: A | Discharge status: Home / Self Care | Payer: Medicare Other | Attending: Family Medicine | Admitting: Family Medicine

## 2020-12-09 ENCOUNTER — Ambulatory Visit (INDEPENDENT_AMBULATORY_CARE_PROVIDER_SITE_OTHER): Payer: Medicare Other

## 2020-12-09 ENCOUNTER — Other Ambulatory Visit: Payer: Self-pay

## 2020-12-09 ENCOUNTER — Ambulatory Visit: Admission: EM | Admit: 2020-12-09 | Discharge status: Absent without leave | Payer: Self-pay | Source: Home / Self Care

## 2020-12-09 DIAGNOSIS — W19XXXA Unspecified fall, initial encounter: Secondary | ICD-10-CM

## 2020-12-09 DIAGNOSIS — S42252A Displaced fracture of greater tuberosity of left humerus, initial encounter for closed fracture: Secondary | ICD-10-CM | POA: Diagnosis not present

## 2020-12-09 DIAGNOSIS — M25511 Pain in right shoulder: Secondary | ICD-10-CM

## 2020-12-09 DIAGNOSIS — S42291A Other displaced fracture of upper end of right humerus, initial encounter for closed fracture: Secondary | ICD-10-CM | POA: Diagnosis not present

## 2020-12-09 NOTE — ED Triage Notes (Signed)
Pt presents with injury to right shoulder from fall earlier

## 2020-12-09 NOTE — Discharge Instructions (Signed)
You will need to get more pain medication from rheumatology or from orthopedics  We have given you a sling in the office. Wear this at all times.   Call Dr Romeo Apple for follow up  Go to the ER if pain is getting out of control

## 2020-12-10 ENCOUNTER — Other Ambulatory Visit: Payer: Self-pay

## 2020-12-10 ENCOUNTER — Ambulatory Visit: Payer: Medicare Other | Admitting: Orthopedic Surgery

## 2020-12-10 ENCOUNTER — Encounter: Payer: Self-pay | Admitting: Orthopedic Surgery

## 2020-12-10 VITALS — BP 129/75 | HR 82 | Ht 64.0 in | Wt 160.0 lb

## 2020-12-10 DIAGNOSIS — S42251A Displaced fracture of greater tuberosity of right humerus, initial encounter for closed fracture: Secondary | ICD-10-CM | POA: Diagnosis not present

## 2020-12-10 DIAGNOSIS — E782 Mixed hyperlipidemia: Secondary | ICD-10-CM | POA: Insufficient documentation

## 2020-12-10 DIAGNOSIS — G2581 Restless legs syndrome: Secondary | ICD-10-CM | POA: Insufficient documentation

## 2020-12-10 DIAGNOSIS — M069 Rheumatoid arthritis, unspecified: Secondary | ICD-10-CM | POA: Insufficient documentation

## 2020-12-10 DIAGNOSIS — E2839 Other primary ovarian failure: Secondary | ICD-10-CM | POA: Insufficient documentation

## 2020-12-10 DIAGNOSIS — D7589 Other specified diseases of blood and blood-forming organs: Secondary | ICD-10-CM | POA: Insufficient documentation

## 2020-12-10 DIAGNOSIS — W19XXXA Unspecified fall, initial encounter: Secondary | ICD-10-CM | POA: Diagnosis not present

## 2020-12-10 MED ORDER — OXYCODONE HCL 5 MG PO TABS: 5.0000 mg | ORAL_TABLET | Freq: Four times a day (QID) | ORAL | 0 refills | Status: AC | PRN

## 2020-12-10 NOTE — Progress Notes (Signed)
New Patient Visit  Assessment: Patricia Clark is a 80 y.o. RHD female with the following: Right, minimally displaced greater tuberosity fracture.  Plan: Reviewed radiographs with the patient in clinic today, which demonstrates minimally displaced right greater tuberosity fracture.  I advised her to remain in the sling, at all times for at least the next 2 weeks.  We will give the greater tuberosity fragment of chance to heal, after which time she should regain her function.  She is on chronic oxycodone therapy, related to her diagnosis of rheumatoid arthritis, however she is not currently in need of a new prescription.  I provided her with some medication to help her for the next 1-2 weeks.  I have advised her to contact her rheumatologist for an updated prescription for oxycodone moving forward.  She stated her understanding.  We will see her in 2 weeks for repeat evaluation.  Depending on x-rays at that time, we may allow her to start in her right shoulder.   Follow-up: Return in about 2 weeks (around 12/24/2020).  Subjective:  Chief Complaint  Patient presents with  . Shoulder Injury    Fall on 12/06/20     History of Present Illness: Patricia Clark is a 80 y.o. female who presents for evaluation of her right shoulder.  She fell, landing on her right side a few days ago.  She had immediate pain in the right shoulder, but she not present for evaluation at that time.  After couple of days of persistent pain, she presented to an urgent care facility, and was noted to have a proximal humerus fracture.  She was therefore referred to clinic today.  She has been wearing a sling in her right arm most of the time.  Her pain has been controlled with oxycodone.  She does carry a diagnosis of rheumatoid arthritis, and has previously taken oxycodone for pain.  This is provided by her rheumatologist.  Currently, she has no numbness or tingling in the right upper extremity.  No injury elsewhere.  She did not hit  her head.   Review of Systems: No fevers or chills No numbness or tingling No chest pain No shortness of breath No bowel or bladder dysfunction No GI distress No headaches   Medical History:  Past Medical History:  Diagnosis Date  . A-fib (HCC)   . Hyperlipidemia   . Hypertension   . Osteoarthritis   . Rheumatoid arthritis (HCC)   . Wrist fracture     Past Surgical History:  Procedure Laterality Date  . APPENDECTOMY    . CATARACT EXTRACTION     BOTH EYES  . CHOLECYSTECTOMY    . HYSTERECTOMY ABDOMINAL WITH SALPINGECTOMY      Family History  Problem Relation Age of Onset  . COPD Mother   . Dementia Mother   . Heart attack Father   . Heart disease Sister   . Heart disease Sister    Social History   Tobacco Use  . Smoking status: Former Smoker    Packs/day: 0.25    Years: 6.00    Pack years: 1.50    Quit date: 12/08/1998    Years since quitting: 22.0  . Smokeless tobacco: Never Used  Vaping Use  . Vaping Use: Never used  Substance Use Topics  . Alcohol use: Yes    Comment: occ  . Drug use: Never    No Known Allergies  Current Meds  Medication Sig  . Ascorbic Acid (VITAMIN C) 500 MG CAPS Take 500  mg by mouth daily.  . folic acid (FOLVITE) 1 MG tablet Take 1 mg by mouth daily.  . hydroxychloroquine (PLAQUENIL) 200 MG tablet Take 200 mg by mouth 2 (two) times daily.  . metoprolol succinate (TOPROL-XL) 50 MG 24 hr tablet TAKE 1 TABLET (50 MG TOTAL) BY MOUTH DAILY. TAKE WITH OR IMMEDIATELY FOLLOWING A MEAL.  Marland Kitchen oxyCODONE (ROXICODONE) 5 MG immediate release tablet Take 1 tablet (5 mg total) by mouth every 6 (six) hours as needed for up to 7 days for severe pain.  Marland Kitchen oxyCODONE-acetaminophen (PERCOCET/ROXICET) 5-325 MG tablet Take 1 tablet by mouth every 12 (twelve) hours as needed.  . Potassium 99 MG TABS Take 1 tablet (99 mg total) by mouth daily.  . pramipexole (MIRAPEX) 0.125 MG tablet Take 0.125 mg by mouth 2 (two) times daily.   . simvastatin (ZOCOR) 20  MG tablet TAKE 1 TABLET BY MOUTH EVERY DAY  . spironolactone (ALDACTONE) 25 MG tablet TAKE 1 TABLET BY MOUTH EVERY DAY  . XARELTO 20 MG TABS tablet TAKE 1 TABLET (20 MG TOTAL) BY MOUTH DAILY WITH SUPPER.    Objective: BP 129/75   Pulse 82   Ht 5\' 4"  (1.626 m)   Wt 160 lb (72.6 kg)   BMI 27.46 kg/m   Physical Exam:  General: Alert and oriented.  No acute distress  Gait: Normal  Evaluation of right shoulder demonstrates no deformity.  She has some bruising in the upper arm.  Tenderness to palpation about the right shoulder.  Sensation is intact in the axillary patch.  The deltoid muscle does fire.  Fingers are warm and well-perfused.  2+ DP pulse.  She is currently in a sling, and range of motion is otherwise deferred.    IMAGING: I personally reviewed images previously obtained from the ED   X-rays of the right shoulder were obtained previously and demonstrates a minimally displaced greater tuberosity fracture fragment.  The glenohumeral joint remains reduced.  No other injuries are noted.   New Medications:  Meds ordered this encounter  Medications  . oxyCODONE (ROXICODONE) 5 MG immediate release tablet    Sig: Take 1 tablet (5 mg total) by mouth every 6 (six) hours as needed for up to 7 days for severe pain.    Dispense:  28 tablet    Refill:  0      , MD  12/10/2020 11:14 PM

## 2020-12-10 NOTE — Patient Instructions (Signed)
Sling at all times.  Can remove for hygiene only.

## 2020-12-13 ENCOUNTER — Ambulatory Visit: Payer: Medicare Other | Admitting: Orthopedic Surgery

## 2020-12-14 NOTE — ED Provider Notes (Signed)
[facility]   [id number] 12/09/20 Arrival Time: 8315  VV:OHYWV PAIN  SUBJECTIVE: History from: patient. Patricia Clark is a 80 y.o. female complains of right shoulder pain that began 3 days ago.  Reports that she tripped over something in the house and fell. Describes the pain as constant and achy in character with intermittent sharp pain. Has been taking home oxycodone with mild pain relief. Symptoms are made worse with activity.  Denies similar symptoms in the past. Denies fever, chills, erythema, ecchymosis, effusion, weakness, numbness and tingling, saddle paresthesias, loss of bowel or bladder function.      ROS: As per HPI.  All other pertinent ROS negative.     Past Medical History:  Diagnosis Date   A-fib (HCC)    Hyperlipidemia    Hypertension    Osteoarthritis    Rheumatoid arthritis (HCC)    Wrist fracture    Past Surgical History:  Procedure Laterality Date   APPENDECTOMY     CATARACT EXTRACTION     BOTH EYES   CHOLECYSTECTOMY     HYSTERECTOMY ABDOMINAL WITH SALPINGECTOMY     No Known Allergies No current facility-administered medications on file prior to encounter.   Current Outpatient Medications on File Prior to Encounter  Medication Sig Dispense Refill   Ascorbic Acid (VITAMIN C) 500 MG CAPS Take 500 mg by mouth daily.     folic acid (FOLVITE) 1 MG tablet Take 1 mg by mouth daily.     hydroxychloroquine (PLAQUENIL) 200 MG tablet Take 200 mg by mouth 2 (two) times daily.     metoprolol succinate (TOPROL-XL) 50 MG 24 hr tablet TAKE 1 TABLET (50 MG TOTAL) BY MOUTH DAILY. TAKE WITH OR IMMEDIATELY FOLLOWING A MEAL. 90 tablet 3   oxyCODONE-acetaminophen (PERCOCET/ROXICET) 5-325 MG tablet Take 1 tablet by mouth every 12 (twelve) hours as needed.     Potassium 99 MG TABS Take 1 tablet (99 mg total) by mouth daily. 1 tablet 0   pramipexole (MIRAPEX) 0.125 MG tablet Take 0.125 mg by mouth 2 (two) times daily.      simvastatin (ZOCOR) 20 MG tablet TAKE 1 TABLET BY  MOUTH EVERY DAY 90 tablet 3   spironolactone (ALDACTONE) 25 MG tablet TAKE 1 TABLET BY MOUTH EVERY DAY 90 tablet 3   XARELTO 20 MG TABS tablet TAKE 1 TABLET (20 MG TOTAL) BY MOUTH DAILY WITH SUPPER. 90 tablet 1   Social History   Socioeconomic History   Marital status: Divorced    Spouse name: Not on file   Number of children: 2   Years of education: Not on file   Highest education level: Not on file  Occupational History   Not on file  Tobacco Use   Smoking status: Former    Packs/day: 0.25    Years: 6.00    Pack years: 1.50    Types: Cigarettes    Quit date: 12/08/1998    Years since quitting: 22.0   Smokeless tobacco: Never  Vaping Use   Vaping Use: Never used  Substance and Sexual Activity   Alcohol use: Yes    Comment: occ   Drug use: Never   Sexual activity: Not on file  Other Topics Concern   Not on file  Social History Narrative   Not on file   Social Determinants of Health   Financial Resource Strain: Not on file  Food Insecurity: Not on file  Transportation Needs: Not on file  Physical Activity: Not on file  Stress: Not on  file  Social Connections: Not on file  Intimate Partner Violence: Not on file   Family History  Problem Relation Age of Onset   COPD Mother    Dementia Mother    Heart attack Father    Heart disease Sister    Heart disease Sister     OBJECTIVE:  Vitals:   12/09/20 1038  BP: 138/74  Pulse: 73  Resp: 16  Temp: 97.6 F (36.4 C)  TempSrc: Oral  SpO2: 96%    General appearance: ALERT; in no acute distress.  Head: NCAT Lungs: Normal respiratory effort CV: pulses 2+ bilaterally. Cap refill < 2 seconds Musculoskeletal:  Inspection: Skin warm, dry, clear and intact Erythema, effusion to right shoulder Palpation: right shoulder tender to palpation ROM: Limited ROM active and passive to right shoulder Skin: warm and dry Neurologic: Ambulates without difficulty; Sensation intact about the upper/ lower  extremities Psychological: alert and cooperative; normal mood and affect  DIAGNOSTIC STUDIES:  No results found.   ASSESSMENT & PLAN:  1. Other closed displaced fracture of proximal end of right humerus, initial encounter   2. Fall, initial encounter   3. Acute pain of right shoulder    Xray today shows closed displaced fracture of proximal end of right humerus Sling applied in office Continue home pain medication Follow up with orthopedics ASAP Return or go to the ER if you have any new or worsening symptoms (fever, chills, chest pain, abdominal pain, changes in bowel or bladder habits, pain radiating into lower legs)    Controlled Substances Registry consulted for this patient. I feel the risk/benefit ratio today is favorable for proceeding with this prescription for a controlled substance. Medication sedation precautions given.  Reviewed expectations re: course of current medical issues. Questions answered. Outlined signs and symptoms indicating need for more acute intervention. Patient verbalized understanding. After Visit Summary given.        Moshe Cipro, NP 12/14/20 [phone]

## 2020-12-24 ENCOUNTER — Encounter: Payer: Medicare Other | Admitting: Orthopedic Surgery

## 2020-12-31 ENCOUNTER — Other Ambulatory Visit: Payer: Self-pay

## 2020-12-31 ENCOUNTER — Ambulatory Visit: Payer: Medicare Other

## 2020-12-31 ENCOUNTER — Ambulatory Visit (INDEPENDENT_AMBULATORY_CARE_PROVIDER_SITE_OTHER): Payer: Medicare Other | Admitting: Orthopedic Surgery

## 2020-12-31 ENCOUNTER — Encounter: Payer: Self-pay | Admitting: Orthopedic Surgery

## 2020-12-31 VITALS — Ht 64.0 in | Wt 156.0 lb

## 2020-12-31 DIAGNOSIS — M25531 Pain in right wrist: Secondary | ICD-10-CM

## 2020-12-31 DIAGNOSIS — S42251D Displaced fracture of greater tuberosity of right humerus, subsequent encounter for fracture with routine healing: Secondary | ICD-10-CM

## 2020-12-31 NOTE — Progress Notes (Signed)
Orthopaedic Clinic Return  Assessment: Patricia Clark is a 80 y.o. female with the following: Right greater tuberosity fracture, minimally displaced Right wrist pain in setting of previous distal radius fracture   Plan: Reviewed radiographs with the patient both her right shoulder and right wrist.  Her right shoulder is improving.  X-rays demonstrate a minimally displaced greater tuberosity fracture, without interval displacement.  This appears to be healing well.  She is also had some right wrist pain since falling.  She does have a recent history of a distal radius fracture which was healed without surgery.  Radiographs demonstrates some mild arthritis, as well as some ulnar positivity.  This could be contributing to her pain.  No acute injuries are noted.  Demonstrated some simple exercises she can start for her right shoulder including pendulums, and crawling up the wall with her fingers to increase her forward flexion.  Increased with the progress today.  Medications as needed follow-up in 4 weeks.   Follow-up: Return in about 4 weeks (around 01/28/2021).   Subjective:  Chief Complaint  Patient presents with   Fracture    Rt humeral fx DOI 12/06/20    History of Present Illness: Patricia Clark is a 80 y.o. female who returns to clinic today for repeat evaluation of right shoulder pain.  She fell approximately 3.5 weeks ago and sustained a fracture of the greater tuberosity.  She has been using a sling intermittently, but has stopped using it recently.  Her pain in the shoulder is improving.  She is also starting to have right wrist pain.  Of note, she sustained a distal radius fracture, was treated without surgery.  Limited range of motion of the right shoulder thus far.  She is not using the right arm to lift anything.  Review of Systems: No fevers or chills No numbness or tingling No chest pain No shortness of breath No bowel or bladder dysfunction No GI distress No  headaches   Objective: Ht 5\' 4"  (1.626 m)   Wt 156 lb (70.8 kg)   BMI 26.78 kg/m   Physical Exam:  Alert and oriented.  No acute distress. Normal gait  Evaluation of the right shoulder demonstrates no deformity.  No swelling is appreciated around the shoulder.  Bruising of the upper arm is improving.  Evaluation of the right wrist is without obvious deformity.  She has had pain in the mid aspect of the joint.  Tolerates gentle range of motion.  No significant increase in her pain with ulnar deviation.  Fingers are warm and well-perfused.  2+ radial pulse.  Range of motion of the right shoulder is deferred due to the known fracture.  IMAGING: I personally ordered and reviewed the following images:  X-rays of the right shoulder were obtained in clinic today and demonstrates a minimally displaced right greater tuberosity fracture.  No interval displacement.  No acute injuries are noted.  The glenohumeral joint remains well reduced.  No evidence of proximal humeral migration.  Impression: Healing right greater tuberosity fracture  X-rays of the right wrist were obtained in clinic today and demonstrates no acute injury.  There is evidence of a previous injury.  Ulnar positive.  Some degenerative changes are noted within the wrist joint.  No increase in the SL interval.  Impression: Right wrist, mild degenerative changes and ulnar positivity.   , MD 12/31/2020 12:47 PM

## 2021-01-15 DIAGNOSIS — Z1389 Encounter for screening for other disorder: Secondary | ICD-10-CM | POA: Diagnosis not present

## 2021-01-15 DIAGNOSIS — Z0000 Encounter for general adult medical examination without abnormal findings: Secondary | ICD-10-CM | POA: Diagnosis not present

## 2021-01-28 DIAGNOSIS — E782 Mixed hyperlipidemia: Secondary | ICD-10-CM | POA: Diagnosis not present

## 2021-01-28 DIAGNOSIS — M0579 Rheumatoid arthritis with rheumatoid factor of multiple sites without organ or systems involvement: Secondary | ICD-10-CM | POA: Diagnosis not present

## 2021-01-28 DIAGNOSIS — I10 Essential (primary) hypertension: Secondary | ICD-10-CM | POA: Diagnosis not present

## 2021-01-28 DIAGNOSIS — I480 Paroxysmal atrial fibrillation: Secondary | ICD-10-CM | POA: Diagnosis not present

## 2021-01-29 ENCOUNTER — Other Ambulatory Visit: Payer: Self-pay

## 2021-01-29 ENCOUNTER — Ambulatory Visit: Payer: Medicare Other

## 2021-01-29 ENCOUNTER — Encounter: Payer: Self-pay | Admitting: Orthopedic Surgery

## 2021-01-29 ENCOUNTER — Ambulatory Visit (INDEPENDENT_AMBULATORY_CARE_PROVIDER_SITE_OTHER): Payer: Medicare Other | Admitting: Orthopedic Surgery

## 2021-01-29 VITALS — Ht 64.0 in | Wt 156.0 lb

## 2021-01-29 DIAGNOSIS — S42251D Displaced fracture of greater tuberosity of right humerus, subsequent encounter for fracture with routine healing: Secondary | ICD-10-CM | POA: Diagnosis not present

## 2021-01-29 NOTE — Progress Notes (Signed)
Orthopaedic Clinic Return  Assessment: Patricia Clark is a 80 y.o. female with the following: Right greater tuberosity fracture, minimally displaced Right wrist pain in setting of previous distal radius fracture   Plan: She is not having any issues with the right shoulder.  She has near full range of motion.  No pain on range of motion testing.  She states she has more discomfort in the right wrist.  At this point, she can resume her activities as tolerated.  No further follow-up is needed.  If she notices any issues with the right shoulder, she can return to clinic for repeat evaluation.   Follow-up: Return if symptoms worsen or fail to improve.   Subjective:  Chief Complaint  Patient presents with   Fracture    Rt shoulder fx DOI 12/06/20    History of Present Illness: Patricia Clark is a 80 y.o. female who returns to clinic today for repeat evaluation of right shoulder pain.  She continues to do well.  She is not having any pain in her right shoulder.  She does not feel as though she is limited.  She does have pain in the right wrist, due to a previous fracture.  She did not take any medication on a consistent basis.  She is not using a sling.  Review of Systems: No fevers or chills No numbness or tingling No chest pain No shortness of breath No bowel or bladder dysfunction No GI distress No headaches   Objective: Ht 5\' 4"  (1.626 m)   Wt 156 lb (70.8 kg)   BMI 26.78 kg/m   Physical Exam:  Alert and oriented.  No acute distress. Normal gait  Evaluation of right shoulder demonstrates no deformity.  No atrophy is appreciated.  She has near full range of motion with minimal discomfort.  4+/5 strength testing of the supraspinatus and infraspinatus.  Mild deformity of the right wrist.  Tenderness to palpation.  Mildly restricted range of motion of the right wrist.  Fingers are warm and well-perfused.  IMAGING: I personally ordered and reviewed the following images:  X-rays of  the right shoulder were obtained in clinic today, and demonstrates good alignment of the greater tuberosity fractures.  There is been no interval displacement.  Right glenohumeral joint remains reduced.  Impression: Healing right greater tuberosity fracture  , MD 01/29/2021 9:40 PM

## 2021-02-03 DIAGNOSIS — Z79899 Other long term (current) drug therapy: Secondary | ICD-10-CM | POA: Diagnosis not present

## 2021-02-03 DIAGNOSIS — M2550 Pain in unspecified joint: Secondary | ICD-10-CM | POA: Diagnosis not present

## 2021-02-03 DIAGNOSIS — M5136 Other intervertebral disc degeneration, lumbar region: Secondary | ICD-10-CM | POA: Diagnosis not present

## 2021-02-03 DIAGNOSIS — M25531 Pain in right wrist: Secondary | ICD-10-CM | POA: Diagnosis not present

## 2021-02-03 DIAGNOSIS — M0579 Rheumatoid arthritis with rheumatoid factor of multiple sites without organ or systems involvement: Secondary | ICD-10-CM | POA: Diagnosis not present

## 2021-02-03 DIAGNOSIS — R5382 Chronic fatigue, unspecified: Secondary | ICD-10-CM | POA: Diagnosis not present

## 2021-02-03 DIAGNOSIS — M150 Primary generalized (osteo)arthritis: Secondary | ICD-10-CM | POA: Diagnosis not present

## 2021-02-26 DIAGNOSIS — I10 Essential (primary) hypertension: Secondary | ICD-10-CM | POA: Diagnosis not present

## 2021-02-26 DIAGNOSIS — E782 Mixed hyperlipidemia: Secondary | ICD-10-CM | POA: Diagnosis not present

## 2021-02-26 DIAGNOSIS — M0579 Rheumatoid arthritis with rheumatoid factor of multiple sites without organ or systems involvement: Secondary | ICD-10-CM | POA: Diagnosis not present

## 2021-02-26 DIAGNOSIS — I480 Paroxysmal atrial fibrillation: Secondary | ICD-10-CM | POA: Diagnosis not present

## 2021-03-24 DIAGNOSIS — I480 Paroxysmal atrial fibrillation: Secondary | ICD-10-CM | POA: Diagnosis not present

## 2021-03-24 DIAGNOSIS — M0579 Rheumatoid arthritis with rheumatoid factor of multiple sites without organ or systems involvement: Secondary | ICD-10-CM | POA: Diagnosis not present

## 2021-03-24 DIAGNOSIS — I10 Essential (primary) hypertension: Secondary | ICD-10-CM | POA: Diagnosis not present

## 2021-03-24 DIAGNOSIS — E782 Mixed hyperlipidemia: Secondary | ICD-10-CM | POA: Diagnosis not present

## 2021-04-21 DIAGNOSIS — M25531 Pain in right wrist: Secondary | ICD-10-CM | POA: Diagnosis not present

## 2021-04-21 DIAGNOSIS — R5382 Chronic fatigue, unspecified: Secondary | ICD-10-CM | POA: Diagnosis not present

## 2021-04-21 DIAGNOSIS — Z79899 Other long term (current) drug therapy: Secondary | ICD-10-CM | POA: Diagnosis not present

## 2021-04-21 DIAGNOSIS — M0579 Rheumatoid arthritis with rheumatoid factor of multiple sites without organ or systems involvement: Secondary | ICD-10-CM | POA: Diagnosis not present

## 2021-04-21 DIAGNOSIS — M5136 Other intervertebral disc degeneration, lumbar region: Secondary | ICD-10-CM | POA: Diagnosis not present

## 2021-04-21 DIAGNOSIS — M2550 Pain in unspecified joint: Secondary | ICD-10-CM | POA: Diagnosis not present

## 2021-04-21 DIAGNOSIS — M150 Primary generalized (osteo)arthritis: Secondary | ICD-10-CM | POA: Diagnosis not present

## 2021-04-21 DIAGNOSIS — R7989 Other specified abnormal findings of blood chemistry: Secondary | ICD-10-CM | POA: Diagnosis not present

## 2021-05-26 DIAGNOSIS — M0579 Rheumatoid arthritis with rheumatoid factor of multiple sites without organ or systems involvement: Secondary | ICD-10-CM | POA: Diagnosis not present

## 2021-05-26 DIAGNOSIS — I480 Paroxysmal atrial fibrillation: Secondary | ICD-10-CM | POA: Diagnosis not present

## 2021-05-26 DIAGNOSIS — E782 Mixed hyperlipidemia: Secondary | ICD-10-CM | POA: Diagnosis not present

## 2021-05-26 DIAGNOSIS — I10 Essential (primary) hypertension: Secondary | ICD-10-CM | POA: Diagnosis not present

## 2021-06-02 ENCOUNTER — Other Ambulatory Visit: Payer: Self-pay | Admitting: Cardiology

## 2021-06-12 DIAGNOSIS — I10 Essential (primary) hypertension: Secondary | ICD-10-CM | POA: Diagnosis not present

## 2021-06-12 DIAGNOSIS — G2581 Restless legs syndrome: Secondary | ICD-10-CM | POA: Diagnosis not present

## 2021-06-12 DIAGNOSIS — I480 Paroxysmal atrial fibrillation: Secondary | ICD-10-CM | POA: Diagnosis not present

## 2021-06-12 DIAGNOSIS — E782 Mixed hyperlipidemia: Secondary | ICD-10-CM | POA: Diagnosis not present

## 2021-06-12 DIAGNOSIS — M0579 Rheumatoid arthritis with rheumatoid factor of multiple sites without organ or systems involvement: Secondary | ICD-10-CM | POA: Diagnosis not present

## 2021-07-17 ENCOUNTER — Ambulatory Visit: Payer: Medicare Other | Admitting: Cardiology

## 2021-07-29 DIAGNOSIS — M25531 Pain in right wrist: Secondary | ICD-10-CM | POA: Diagnosis not present

## 2021-07-29 DIAGNOSIS — M150 Primary generalized (osteo)arthritis: Secondary | ICD-10-CM | POA: Diagnosis not present

## 2021-07-29 DIAGNOSIS — R7989 Other specified abnormal findings of blood chemistry: Secondary | ICD-10-CM | POA: Diagnosis not present

## 2021-07-29 DIAGNOSIS — R5382 Chronic fatigue, unspecified: Secondary | ICD-10-CM | POA: Diagnosis not present

## 2021-07-29 DIAGNOSIS — M2550 Pain in unspecified joint: Secondary | ICD-10-CM | POA: Diagnosis not present

## 2021-07-29 DIAGNOSIS — M5136 Other intervertebral disc degeneration, lumbar region: Secondary | ICD-10-CM | POA: Diagnosis not present

## 2021-07-29 DIAGNOSIS — Z79899 Other long term (current) drug therapy: Secondary | ICD-10-CM | POA: Diagnosis not present

## 2021-07-29 DIAGNOSIS — M0579 Rheumatoid arthritis with rheumatoid factor of multiple sites without organ or systems involvement: Secondary | ICD-10-CM | POA: Diagnosis not present

## 2021-08-01 ENCOUNTER — Encounter: Payer: Self-pay | Admitting: Cardiology

## 2021-08-01 ENCOUNTER — Ambulatory Visit: Payer: Medicare Other | Admitting: Cardiology

## 2021-08-01 ENCOUNTER — Other Ambulatory Visit: Payer: Self-pay

## 2021-08-01 VITALS — BP 125/71 | HR 57 | Temp 98.4°F | Resp 17 | Ht 64.0 in | Wt 154.0 lb

## 2021-08-01 DIAGNOSIS — I10 Essential (primary) hypertension: Secondary | ICD-10-CM | POA: Diagnosis not present

## 2021-08-01 DIAGNOSIS — I480 Paroxysmal atrial fibrillation: Secondary | ICD-10-CM

## 2021-08-01 NOTE — Progress Notes (Signed)
° ° °Subjective:  ° °Patricia Clark, female    DOB: 06/24/1941, 81 y.o.   MRN: [id number] ° ° °Chief complaint:  °PAF ° ° °HPI ° °81 y.o.  female with hypertension, RA, PAF. ° °Patient is doing well, denies any complaints. Reviewed recent lab results, details below.  ° ° °Current Outpatient Medications on File Prior to Visit  °Medication Sig Dispense Refill  ° Ascorbic Acid (VITAMIN C) 500 MG CAPS Take 500 mg by mouth daily.    ° folic acid (FOLVITE) 1 MG tablet Take 1 mg by mouth daily.    ° hydroxychloroquine (PLAQUENIL) 200 MG tablet Take 200 mg by mouth 2 (two) times daily.    ° metoprolol succinate (TOPROL-XL) 50 MG 24 hr tablet TAKE 1 TABLET (50 MG TOTAL) BY MOUTH DAILY. TAKE WITH OR IMMEDIATELY FOLLOWING A MEAL. 90 tablet 3  ° oxyCODONE-acetaminophen (PERCOCET/ROXICET) 5-325 MG tablet Take 1 tablet by mouth every 12 (twelve) hours as needed. (Patient not taking: No sig reported)    ° Potassium 99 MG TABS Take 1 tablet (99 mg total) by mouth daily. 1 tablet 0  ° pramipexole (MIRAPEX) 0.125 MG tablet Take 0.125 mg by mouth 2 (two) times daily.     ° simvastatin (ZOCOR) 20 MG tablet TAKE 1 TABLET BY MOUTH EVERY DAY 90 tablet 3  ° spironolactone (ALDACTONE) 25 MG tablet TAKE 1 TABLET BY MOUTH EVERY DAY 90 tablet 3  ° XARELTO 20 MG TABS tablet TAKE ONE TABLET BY MOUTH EVERY EVENING 90 tablet 1  ° °No current facility-administered medications on file prior to visit.  ° ° °Cardiovascular studies: ° °EKG 08/01/2021: °Sinus rhythm 59 bpm w/frequent PACs °Anteroseptal infarct -age undetermined ° °EKG 07/17/2020: °Sinus rhythm 60 bpm °Occasional PAC °Nonspecific ST-T changes ° °Echocardiogram 05/18/2017: °Left ventricle cavity is normal in size. Mild decrease in global wall motion. Doppler evidence of grade I (impaired) diastolic dysfunction, normal LAP. LVEF 45-50%. °Left atrial cavity is mildly dilated. °Mild Mitral (Grade I) regurgitation. °Mild tricuspid regurgitation. Estimated pulmonary artery systolic pressure 25  mmHg °No significant change compared to prior study dated 05/05/2016. ° °Event monitor 04/14/2017-06/12/2017: °Paroxysmal atrial fibrillation.  Maximum heart rate 124 bpm. ° °Recent labs: °06/12/2021: °Glucose 87, BUN/Cr 14/0.7. EGFR 83. °Chol 167, TG 108, HDL 81, LDL 67 °TSH 2.6 normal ° °04/12/2020: °Glucose 133, BUN/Cr 5/0.64. EGFR >60. Na/K 139/3.2. Rest of the CMP normal °H/H 11/36. MCV 100. Platelets 437 ° °Results for Seyer, Yamilet (MRN [id number]) as of 07/17/2020 12:54 ° Ref. Range 04/12/2020 13:50 04/12/2020 16:49  °Troponin I (High Sensitivity) Latest Ref Range: <18 ng/L 14 15  ° ° °11/03/2018: °Glucose 88, BUN/Cr 11/0.62. EGFR normal. Na/K 141/3.9. Rest of the CMP normal °H/H 11.2/32.3. MCV 93. Platelets 424 ° ° ° °Review of Systems  °Cardiovascular:  Negative for chest pain, dyspnea on exertion, leg swelling, palpitations and syncope.  ° °   °Vitals:  ° 08/01/21 1402 08/01/21 1410  °BP: (!) 144/84 125/71  °Pulse: 65 (!) 57  °Resp: 17   °Temp: 98.4 °F (36.9 °C)   °SpO2: 98% 98%  ° ° ° °Objective:  °  °Physical Exam °Vitals and nursing note reviewed.  °Constitutional:   °   General: She is not in acute distress. °Neck:  °   Vascular: No JVD.  °Cardiovascular:  °   Rate and Rhythm: Normal rate and regular rhythm.  °   Heart sounds: Normal heart sounds. No murmur heard. °Pulmonary:  °   Effort: Pulmonary effort is normal.  °     Breath sounds: Normal breath sounds. No wheezing or rales.  °Musculoskeletal:  °   Right lower leg: No edema.  °   Left lower leg: No edema.  ° ° ° ° ° °   °Assessment & Recommendations:  ° °81 y.o.  female with hypertension, RA, PAF. ° °PAF: °CHA2DS2VASc score 4. Annual stroke risk 4%. Continue Xarelto 20 mg daily. ° °Hypertension: °Controlled. She is on spirolactone, as well as potassium. Her K was 3.2 in 04/2019. °She had recent lab check, K value not available for my review. [place] to PCP ° °F/u in 1 year. ° ° J , MD °[facility] °Pager: [phone] °Office:  [phone] °If no answer Cell [phone] °   ° °

## 2021-08-05 DIAGNOSIS — E782 Mixed hyperlipidemia: Secondary | ICD-10-CM | POA: Diagnosis not present

## 2021-08-05 DIAGNOSIS — M0579 Rheumatoid arthritis with rheumatoid factor of multiple sites without organ or systems involvement: Secondary | ICD-10-CM | POA: Diagnosis not present

## 2021-08-05 DIAGNOSIS — I10 Essential (primary) hypertension: Secondary | ICD-10-CM | POA: Diagnosis not present

## 2021-08-25 DIAGNOSIS — I10 Essential (primary) hypertension: Secondary | ICD-10-CM | POA: Diagnosis not present

## 2021-08-25 DIAGNOSIS — E782 Mixed hyperlipidemia: Secondary | ICD-10-CM | POA: Diagnosis not present

## 2021-08-25 DIAGNOSIS — I480 Paroxysmal atrial fibrillation: Secondary | ICD-10-CM | POA: Diagnosis not present

## 2021-08-25 DIAGNOSIS — M0579 Rheumatoid arthritis with rheumatoid factor of multiple sites without organ or systems involvement: Secondary | ICD-10-CM | POA: Diagnosis not present

## 2021-10-02 DIAGNOSIS — E559 Vitamin D deficiency, unspecified: Secondary | ICD-10-CM | POA: Diagnosis not present

## 2021-10-02 DIAGNOSIS — I4891 Unspecified atrial fibrillation: Secondary | ICD-10-CM | POA: Diagnosis not present

## 2021-10-02 DIAGNOSIS — J309 Allergic rhinitis, unspecified: Secondary | ICD-10-CM | POA: Diagnosis not present

## 2021-10-02 DIAGNOSIS — R059 Cough, unspecified: Secondary | ICD-10-CM | POA: Diagnosis not present

## 2021-10-02 DIAGNOSIS — N390 Urinary tract infection, site not specified: Secondary | ICD-10-CM | POA: Diagnosis not present

## 2021-10-02 DIAGNOSIS — E785 Hyperlipidemia, unspecified: Secondary | ICD-10-CM | POA: Diagnosis not present

## 2021-10-02 DIAGNOSIS — Z139 Encounter for screening, unspecified: Secondary | ICD-10-CM | POA: Diagnosis not present

## 2021-10-02 DIAGNOSIS — I10 Essential (primary) hypertension: Secondary | ICD-10-CM | POA: Diagnosis not present

## 2021-10-02 DIAGNOSIS — Z0189 Encounter for other specified special examinations: Secondary | ICD-10-CM | POA: Diagnosis not present

## 2021-10-02 DIAGNOSIS — R410 Disorientation, unspecified: Secondary | ICD-10-CM | POA: Diagnosis not present

## 2021-10-02 DIAGNOSIS — G43909 Migraine, unspecified, not intractable, without status migrainosus: Secondary | ICD-10-CM | POA: Diagnosis not present

## 2021-10-02 DIAGNOSIS — M069 Rheumatoid arthritis, unspecified: Secondary | ICD-10-CM | POA: Diagnosis not present

## 2021-10-02 DIAGNOSIS — M810 Age-related osteoporosis without current pathological fracture: Secondary | ICD-10-CM | POA: Diagnosis not present

## 2021-10-09 DIAGNOSIS — M069 Rheumatoid arthritis, unspecified: Secondary | ICD-10-CM | POA: Diagnosis not present

## 2021-10-09 DIAGNOSIS — H6121 Impacted cerumen, right ear: Secondary | ICD-10-CM | POA: Diagnosis not present

## 2021-10-09 DIAGNOSIS — E559 Vitamin D deficiency, unspecified: Secondary | ICD-10-CM | POA: Diagnosis not present

## 2021-10-09 DIAGNOSIS — I10 Essential (primary) hypertension: Secondary | ICD-10-CM | POA: Diagnosis not present

## 2021-10-09 DIAGNOSIS — Z0001 Encounter for general adult medical examination with abnormal findings: Secondary | ICD-10-CM | POA: Diagnosis not present

## 2021-10-09 DIAGNOSIS — R059 Cough, unspecified: Secondary | ICD-10-CM | POA: Diagnosis not present

## 2021-10-09 DIAGNOSIS — I4891 Unspecified atrial fibrillation: Secondary | ICD-10-CM | POA: Diagnosis not present

## 2021-10-28 DIAGNOSIS — M2550 Pain in unspecified joint: Secondary | ICD-10-CM | POA: Diagnosis not present

## 2021-10-28 DIAGNOSIS — M0579 Rheumatoid arthritis with rheumatoid factor of multiple sites without organ or systems involvement: Secondary | ICD-10-CM | POA: Diagnosis not present

## 2021-10-28 DIAGNOSIS — R7989 Other specified abnormal findings of blood chemistry: Secondary | ICD-10-CM | POA: Diagnosis not present

## 2021-10-28 DIAGNOSIS — R5382 Chronic fatigue, unspecified: Secondary | ICD-10-CM | POA: Diagnosis not present

## 2021-10-28 DIAGNOSIS — Z79899 Other long term (current) drug therapy: Secondary | ICD-10-CM | POA: Diagnosis not present

## 2021-10-28 DIAGNOSIS — M5136 Other intervertebral disc degeneration, lumbar region: Secondary | ICD-10-CM | POA: Diagnosis not present

## 2021-10-28 DIAGNOSIS — M1991 Primary osteoarthritis, unspecified site: Secondary | ICD-10-CM | POA: Diagnosis not present

## 2021-11-02 DIAGNOSIS — I10 Essential (primary) hypertension: Secondary | ICD-10-CM | POA: Diagnosis not present

## 2021-11-02 DIAGNOSIS — E785 Hyperlipidemia, unspecified: Secondary | ICD-10-CM | POA: Diagnosis not present

## 2021-11-02 DIAGNOSIS — I4891 Unspecified atrial fibrillation: Secondary | ICD-10-CM | POA: Diagnosis not present

## 2021-11-14 ENCOUNTER — Other Ambulatory Visit: Payer: Self-pay | Admitting: Cardiology

## 2021-11-27 ENCOUNTER — Other Ambulatory Visit (HOSPITAL_COMMUNITY): Payer: Self-pay | Admitting: Family Medicine

## 2021-11-27 ENCOUNTER — Ambulatory Visit (HOSPITAL_COMMUNITY)
Admission: RE | Admit: 2021-11-27 | Discharge: 2021-11-27 | Disposition: A | Discharge status: Home / Self Care | Payer: Medicare Other | Source: Ambulatory Visit | Attending: Family Medicine | Admitting: Family Medicine

## 2021-11-27 DIAGNOSIS — M79601 Pain in right arm: Secondary | ICD-10-CM | POA: Diagnosis not present

## 2021-11-27 DIAGNOSIS — M25511 Pain in right shoulder: Secondary | ICD-10-CM | POA: Diagnosis not present

## 2021-12-03 DIAGNOSIS — I4891 Unspecified atrial fibrillation: Secondary | ICD-10-CM | POA: Diagnosis not present

## 2021-12-03 DIAGNOSIS — E785 Hyperlipidemia, unspecified: Secondary | ICD-10-CM | POA: Diagnosis not present

## 2021-12-03 DIAGNOSIS — I10 Essential (primary) hypertension: Secondary | ICD-10-CM | POA: Diagnosis not present

## 2021-12-30 DIAGNOSIS — R059 Cough, unspecified: Secondary | ICD-10-CM | POA: Diagnosis not present

## 2021-12-30 DIAGNOSIS — R0981 Nasal congestion: Secondary | ICD-10-CM | POA: Diagnosis not present

## 2021-12-30 DIAGNOSIS — Z0001 Encounter for general adult medical examination with abnormal findings: Secondary | ICD-10-CM | POA: Diagnosis not present

## 2022-03-05 DIAGNOSIS — E785 Hyperlipidemia, unspecified: Secondary | ICD-10-CM | POA: Diagnosis not present

## 2022-03-05 DIAGNOSIS — I4891 Unspecified atrial fibrillation: Secondary | ICD-10-CM | POA: Diagnosis not present

## 2022-03-05 DIAGNOSIS — I10 Essential (primary) hypertension: Secondary | ICD-10-CM | POA: Diagnosis not present

## 2022-03-16 DIAGNOSIS — M79642 Pain in left hand: Secondary | ICD-10-CM | POA: Diagnosis not present

## 2022-03-16 DIAGNOSIS — Z79899 Other long term (current) drug therapy: Secondary | ICD-10-CM | POA: Diagnosis not present

## 2022-03-16 DIAGNOSIS — M5136 Other intervertebral disc degeneration, lumbar region: Secondary | ICD-10-CM | POA: Diagnosis not present

## 2022-03-16 DIAGNOSIS — M1991 Primary osteoarthritis, unspecified site: Secondary | ICD-10-CM | POA: Diagnosis not present

## 2022-03-16 DIAGNOSIS — M79641 Pain in right hand: Secondary | ICD-10-CM | POA: Diagnosis not present

## 2022-03-16 DIAGNOSIS — R7989 Other specified abnormal findings of blood chemistry: Secondary | ICD-10-CM | POA: Diagnosis not present

## 2022-03-16 DIAGNOSIS — M2550 Pain in unspecified joint: Secondary | ICD-10-CM | POA: Diagnosis not present

## 2022-03-16 DIAGNOSIS — R5382 Chronic fatigue, unspecified: Secondary | ICD-10-CM | POA: Diagnosis not present

## 2022-03-16 DIAGNOSIS — M0579 Rheumatoid arthritis with rheumatoid factor of multiple sites without organ or systems involvement: Secondary | ICD-10-CM | POA: Diagnosis not present

## 2022-04-04 ENCOUNTER — Ambulatory Visit
Admission: EM | Admit: 2022-04-04 | Discharge: 2022-04-04 | Disposition: A | Discharge status: Home / Self Care | Payer: Medicare Other | Attending: Family Medicine | Admitting: Family Medicine

## 2022-04-04 DIAGNOSIS — N390 Urinary tract infection, site not specified: Secondary | ICD-10-CM | POA: Insufficient documentation

## 2022-04-04 DIAGNOSIS — R410 Disorientation, unspecified: Secondary | ICD-10-CM | POA: Insufficient documentation

## 2022-04-04 LAB — POCT URINALYSIS DIP (MANUAL ENTRY)
Bilirubin, UA: NEGATIVE
Glucose, UA: NEGATIVE mg/dL
Nitrite, UA: NEGATIVE
Protein Ur, POC: NEGATIVE mg/dL
Spec Grav, UA: 1.02 (ref 1.010–1.025)
Urobilinogen, UA: 0.2 E.U./dL
pH, UA: 5.5 (ref 5.0–8.0)

## 2022-04-04 MED ORDER — CEPHALEXIN 500 MG PO CAPS: 500.0000 mg | ORAL_CAPSULE | Freq: Two times a day (BID) | ORAL | 0 refills | Status: DC

## 2022-04-04 NOTE — ED Provider Notes (Signed)
[facility]    CSN: [id number] Arrival date & time: 04/04/22  0865      History   Chief Complaint Chief Complaint  Patient presents with   Altered Mental Status   Urinary Frequency    HPI Patricia Clark is a 81 y.o. female.   Patient presenting today with daughter for evaluation of urinary frequency, disorientation, abnormal behaviors that started yesterday evening.  Patient denies any pain or discomfort, including dysuria, hematuria, abdominal pain, chest pain, shortness of breath, dizziness, headache and has not fallen or hit her head recently.  Daughter who lives with her states this has happened in the past when she has had a urinary tract infection so they would like to check for this today.  No new medication changes recently.    Past Medical History:  Diagnosis Date   A-fib [facility])    Hyperlipidemia    Hypertension    Osteoarthritis    Rheumatoid arthritis (HCC)    Wrist fracture     Patient Active Problem List   Diagnosis Date Noted   Rheumatoid arthritis (HCC) 12/10/2020   Restless legs syndrome 12/10/2020   Mixed hyperlipidemia 12/10/2020   Macrocytosis 12/10/2020   Decreased estrogen level 12/10/2020   Paroxysmal atrial fibrillation (HCC) 12/08/2018   Essential hypertension 12/08/2018    Past Surgical History:  Procedure Laterality Date   APPENDECTOMY     CATARACT EXTRACTION     BOTH EYES   CHOLECYSTECTOMY     HYSTERECTOMY ABDOMINAL WITH SALPINGECTOMY      OB History   No obstetric history on file.      Home Medications    Prior to Admission medications   Medication Sig Start Date End Date Taking? Authorizing Provider  cephALEXin (KEFLEX) 500 MG capsule Take 1 capsule (500 mg total) by mouth 2 (two) times daily. 04/04/22  Yes Particia Nearing, PA-C  Ascorbic Acid (VITAMIN C) 500 MG CAPS Take 500 mg by mouth daily.    [provider]  ENBREL SURECLICK 50 MG/ML injection Inject 50 mg into the skin once a week. 02/08/21    [provider]  folic acid (FOLVITE) 1 MG tablet Take 1 mg by mouth daily.    [provider]  hydroxychloroquine (PLAQUENIL) 200 MG tablet Take 200 mg by mouth 2 (two) times daily. 04/02/20   [provider]  metoprolol succinate (TOPROL-XL) 50 MG 24 hr tablet TAKE 1 TABLET (50 MG TOTAL) BY MOUTH DAILY. TAKE WITH OR IMMEDIATELY FOLLOWING A MEAL. 06/24/20   Patwardhan, Anabel Bene, MD  oxyCODONE-acetaminophen (PERCOCET/ROXICET) 5-325 MG tablet Take 1 tablet by mouth every 12 (twelve) hours as needed. 08/03/18   [provider]  Potassium 99 MG TABS Take 1 tablet (99 mg total) by mouth daily. 07/17/20   Patwardhan, Anabel Bene, MD  pramipexole (MIRAPEX) 0.125 MG tablet Take 0.125 mg by mouth 2 (two) times daily.  08/04/18   [provider]  simvastatin (ZOCOR) 20 MG tablet TAKE 1 TABLET BY MOUTH EVERY DAY 06/24/20   Patwardhan, Manish J, MD  spironolactone (ALDACTONE) 25 MG tablet TAKE 1 TABLET BY MOUTH EVERY DAY 06/24/20   Patwardhan, Anabel Bene, MD  XARELTO 20 MG TABS tablet TAKE ONE TABLET BY MOUTH EVERY EVENING 11/14/21   Patwardhan, Anabel Bene, MD    Family History Family History  Problem Relation Age of Onset   COPD Mother    Dementia Mother    Heart attack Father    Heart disease Sister    Heart  disease Sister     Social History Social History   Tobacco Use   Smoking status: Former    Packs/day: 0.25    Years: 6.00    Total pack years: 1.50    Types: Cigarettes    Quit date: 12/08/1998    Years since quitting: 23.3   Smokeless tobacco: Never  Vaping Use   Vaping Use: Never used  Substance Use Topics   Alcohol use: Yes    Comment: occ   Drug use: Never     Allergies   Macrobid [nitrofurantoin]   Review of Systems Review of Systems Per HPI  Physical Exam Triage Vital Signs ED Triage Vitals  Enc Vitals Group     BP 04/04/22 1018 (!) 142/84     Pulse Rate 04/04/22 1018 69     Resp 04/04/22 1018 18     Temp 04/04/22 1018 97.7 F  (36.5 C)     Temp Source 04/04/22 1018 Oral     SpO2 04/04/22 1018 96 %     Weight --      Height --      Head Circumference --      Peak Flow --      Pain Score 04/04/22 1023 0     Pain Loc --      Pain Edu? --      Excl. in GC? --    No data found.  Updated Vital Signs BP (!) 142/84 (BP Location: Right Arm)   Pulse 69   Temp 97.7 F (36.5 C) (Oral)   Resp 18   SpO2 96%   Visual Acuity Right Eye Distance:   Left Eye Distance:   Bilateral Distance:    Right Eye Near:   Left Eye Near:    Bilateral Near:     Physical Exam Vitals and nursing note reviewed.  Constitutional:      Appearance: Normal appearance. She is not ill-appearing.  HENT:     Head: Atraumatic.     Mouth/Throat:     Mouth: Mucous membranes are moist.  Eyes:     Extraocular Movements: Extraocular movements intact.     Conjunctiva/sclera: Conjunctivae normal.  Cardiovascular:     Rate and Rhythm: Normal rate.     Heart sounds: Normal heart sounds.  Pulmonary:     Effort: Pulmonary effort is normal.     Breath sounds: Normal breath sounds.  Musculoskeletal:        General: Normal range of motion.     Cervical back: Normal range of motion and neck supple.  Skin:    General: Skin is warm and dry.  Neurological:     Mental Status: She is alert.     Motor: No weakness.     Gait: Gait normal.     Comments: Answering questions appropriately, alert and oriented to name, time, place  Psychiatric:        Mood and Affect: Mood normal.        Thought Content: Thought content normal.        Judgment: Judgment normal.      UC Treatments / Results  Labs (all labs ordered are listed, but only abnormal results are displayed) Labs Reviewed  POCT URINALYSIS DIP (MANUAL ENTRY) - Abnormal; Notable for the following components:      Result Value   Ketones, POC UA trace (5) (*)    Blood, UA moderate (*)    Leukocytes, UA Trace (*)    All other components within normal  limits  URINE CULTURE     EKG   Radiology No results found.  Procedures Procedures (including critical care time)  Medications Ordered in UC Medications - No data to display  Initial Impression / Assessment and Plan / UC Course  I have reviewed the triage vital signs and the nursing notes.  Pertinent labs & imaging results that were available during my care of the patient were reviewed by me and considered in my medical decision making (see chart for details).     She is very well-appearing today and in no acute distress.  Her vital signs are overall benign and reassuring.  Urinalysis with evidence of a possible early urinary tract infection.  Urine culture pending, given the symptoms and her history of these we will start a course of antibiotics while awaiting for the urine culture.  ED for any worsening symptoms.  She already has an appointment with her primary care provider on Monday and will use this as a follow-up.  Final Clinical Impressions(s) / UC Diagnoses   Final diagnoses:  Acute lower UTI  Disorientation     Discharge Instructions      Plenty of fluids, take the full course of antibiotics.  We will let you know if we need to make any changes based on your urine culture when that comes back.  Follow-up with your primary care provider first thing next week for a recheck of your symptoms.  Go to the emergency department if significantly worsening at any time    ED Prescriptions     Medication Sig Dispense Auth. Provider   cephALEXin (KEFLEX) 500 MG capsule Take 1 capsule (500 mg total) by mouth 2 (two) times daily. 10 capsule Volney American, [place]      PDMP not reviewed this encounter.   Volney American, [place] 04/04/22 1049

## 2022-04-04 NOTE — ED Triage Notes (Signed)
Per daughter, pt has being disoriented, moving furniture, having spell since last night, going to the rest room every 15 min. Per daughter she acts like this when she is having an UTI.

## 2022-04-04 NOTE — Discharge Instructions (Signed)
Plenty of fluids, take the full course of antibiotics.  We will let you know if we need to make any changes based on your urine culture when that comes back.  Follow-up with your primary care provider first thing next week for a recheck of your symptoms.  Go to the emergency department if significantly worsening at any time

## 2022-04-05 LAB — URINE CULTURE: Culture: NO GROWTH

## 2022-04-06 DIAGNOSIS — E785 Hyperlipidemia, unspecified: Secondary | ICD-10-CM | POA: Diagnosis not present

## 2022-04-10 DIAGNOSIS — E785 Hyperlipidemia, unspecified: Secondary | ICD-10-CM | POA: Diagnosis not present

## 2022-04-10 DIAGNOSIS — M069 Rheumatoid arthritis, unspecified: Secondary | ICD-10-CM | POA: Diagnosis not present

## 2022-04-10 DIAGNOSIS — I4891 Unspecified atrial fibrillation: Secondary | ICD-10-CM | POA: Diagnosis not present

## 2022-04-10 DIAGNOSIS — I10 Essential (primary) hypertension: Secondary | ICD-10-CM | POA: Diagnosis not present

## 2022-04-10 DIAGNOSIS — M810 Age-related osteoporosis without current pathological fracture: Secondary | ICD-10-CM | POA: Diagnosis not present

## 2022-04-30 DIAGNOSIS — R519 Headache, unspecified: Secondary | ICD-10-CM | POA: Diagnosis not present

## 2022-04-30 DIAGNOSIS — F0393 Unspecified dementia, unspecified severity, with mood disturbance: Secondary | ICD-10-CM | POA: Diagnosis not present

## 2022-05-06 DIAGNOSIS — R519 Headache, unspecified: Secondary | ICD-10-CM | POA: Diagnosis not present

## 2022-05-06 DIAGNOSIS — R0989 Other specified symptoms and signs involving the circulatory and respiratory systems: Secondary | ICD-10-CM | POA: Diagnosis not present

## 2022-05-06 DIAGNOSIS — R059 Cough, unspecified: Secondary | ICD-10-CM | POA: Diagnosis not present

## 2022-05-17 ENCOUNTER — Other Ambulatory Visit: Payer: Self-pay | Admitting: Cardiology

## 2022-05-20 DIAGNOSIS — J329 Chronic sinusitis, unspecified: Secondary | ICD-10-CM | POA: Diagnosis not present

## 2022-05-20 DIAGNOSIS — R059 Cough, unspecified: Secondary | ICD-10-CM | POA: Diagnosis not present

## 2022-05-20 DIAGNOSIS — F0393 Unspecified dementia, unspecified severity, with mood disturbance: Secondary | ICD-10-CM | POA: Diagnosis not present

## 2022-06-12 DIAGNOSIS — R410 Disorientation, unspecified: Secondary | ICD-10-CM | POA: Diagnosis not present

## 2022-07-13 DIAGNOSIS — M5136 Other intervertebral disc degeneration, lumbar region: Secondary | ICD-10-CM | POA: Diagnosis not present

## 2022-07-13 DIAGNOSIS — M0579 Rheumatoid arthritis with rheumatoid factor of multiple sites without organ or systems involvement: Secondary | ICD-10-CM | POA: Diagnosis not present

## 2022-07-13 DIAGNOSIS — Z79899 Other long term (current) drug therapy: Secondary | ICD-10-CM | POA: Diagnosis not present

## 2022-07-13 DIAGNOSIS — M2550 Pain in unspecified joint: Secondary | ICD-10-CM | POA: Diagnosis not present

## 2022-07-13 DIAGNOSIS — Z6824 Body mass index (BMI) 24.0-24.9, adult: Secondary | ICD-10-CM | POA: Diagnosis not present

## 2022-07-13 DIAGNOSIS — M79642 Pain in left hand: Secondary | ICD-10-CM | POA: Diagnosis not present

## 2022-07-13 DIAGNOSIS — R5382 Chronic fatigue, unspecified: Secondary | ICD-10-CM | POA: Diagnosis not present

## 2022-07-13 DIAGNOSIS — M1991 Primary osteoarthritis, unspecified site: Secondary | ICD-10-CM | POA: Diagnosis not present

## 2022-07-13 DIAGNOSIS — R7989 Other specified abnormal findings of blood chemistry: Secondary | ICD-10-CM | POA: Diagnosis not present

## 2022-07-13 DIAGNOSIS — M79641 Pain in right hand: Secondary | ICD-10-CM | POA: Diagnosis not present

## 2022-07-15 DIAGNOSIS — G319 Degenerative disease of nervous system, unspecified: Secondary | ICD-10-CM | POA: Diagnosis not present

## 2022-07-15 DIAGNOSIS — R410 Disorientation, unspecified: Secondary | ICD-10-CM | POA: Diagnosis not present

## 2022-07-15 DIAGNOSIS — R9082 White matter disease, unspecified: Secondary | ICD-10-CM | POA: Diagnosis not present

## 2022-08-03 ENCOUNTER — Encounter: Payer: Self-pay | Admitting: Cardiology

## 2022-08-03 ENCOUNTER — Ambulatory Visit: Payer: Medicare Other | Admitting: Cardiology

## 2022-08-03 VITALS — BP 119/76 | HR 62 | Resp 16 | Ht 64.0 in | Wt 147.0 lb

## 2022-08-03 DIAGNOSIS — I480 Paroxysmal atrial fibrillation: Secondary | ICD-10-CM | POA: Diagnosis not present

## 2022-08-03 DIAGNOSIS — I10 Essential (primary) hypertension: Secondary | ICD-10-CM

## 2022-08-03 NOTE — Progress Notes (Signed)
Subjective:   Patricia Clark, female    DOB: 03-02-1941, 81 y.o.   MRN: [id number]   Chief complaint:  PAF   HPI  82 y.o.  female with hypertension, RA, PAF.  Patient is here today with her granddaughter. She has had a lot of sundowning episodes lately, for which she is seeing Neurology. There are no major memory issues during the daytime.  She has had a few mechanical falls, fortunately without any bleeding or major injury.   Current Outpatient Medications:    Ascorbic Acid (VITAMIN C) 500 MG CAPS, Take 500 mg by mouth daily., Disp: , Rfl:    cephALEXin (KEFLEX) 500 MG capsule, Take 1 capsule (500 mg total) by mouth 2 (two) times daily., Disp: 10 capsule, Rfl: 0   ENBREL SURECLICK 50 MG/ML injection, Inject 50 mg into the skin once a week., Disp: , Rfl:    folic acid (FOLVITE) 1 MG tablet, Take 1 mg by mouth daily., Disp: , Rfl:    hydroxychloroquine (PLAQUENIL) 200 MG tablet, Take 200 mg by mouth 2 (two) times daily., Disp: , Rfl:    metoprolol succinate (TOPROL-XL) 50 MG 24 hr tablet, TAKE 1 TABLET (50 MG TOTAL) BY MOUTH DAILY. TAKE WITH OR IMMEDIATELY FOLLOWING A MEAL., Disp: 90 tablet, Rfl: 3   oxyCODONE-acetaminophen (PERCOCET/ROXICET) 5-325 MG tablet, Take 1 tablet by mouth every 12 (twelve) hours as needed., Disp: , Rfl:    Potassium 99 MG TABS, Take 1 tablet (99 mg total) by mouth daily., Disp: 1 tablet, Rfl: 0   pramipexole (MIRAPEX) 0.125 MG tablet, Take 0.125 mg by mouth 2 (two) times daily. , Disp: , Rfl:    simvastatin (ZOCOR) 20 MG tablet, TAKE 1 TABLET BY MOUTH EVERY DAY, Disp: 90 tablet, Rfl: 3   spironolactone (ALDACTONE) 25 MG tablet, TAKE 1 TABLET BY MOUTH EVERY DAY, Disp: 90 tablet, Rfl: 3   XARELTO 20 MG TABS tablet, TAKE ONE TABLET BY MOUTH EVERY EVENING, Disp: 90 tablet, Rfl: 1  Cardiovascular studies:  EKG 08/03/2022: Sinus rhythm 64 bpm Frequent PACs   Echocardiogram 05/18/2017: Left ventricle cavity is normal in size. Mild decrease in global wall  motion. Doppler evidence of grade I (impaired) diastolic dysfunction, normal LAP. LVEF 45-50%. Left atrial cavity is mildly dilated. Mild Mitral (Grade I) regurgitation. Mild tricuspid regurgitation. Estimated pulmonary artery systolic pressure 25 mmHg No significant change compared to prior study dated 05/05/2016.  Event monitor 04/14/2017-06/12/2017: Paroxysmal atrial fibrillation.  Maximum heart rate 124 bpm.  Recent labs: 06/12/2022: Glucose 92, BUN/Cr 9/0.69. EGFR 87. Na/K 139/4.2. Rest of the CMP normal H/H 13/37. MCV 95. Platelets 265  11/03/2018: Glucose 88, BUN/Cr 11/0.62. EGFR normal. Na/K 141/3.9. Rest of the CMP normal H/H 11.2/32.3. MCV 93. Platelets 424    Review of Systems  Cardiovascular:  Negative for chest pain, dyspnea on exertion, leg swelling, palpitations and syncope.       Vitals:   08/03/22 1101  BP: 119/76  Pulse: 62  Resp: 16  SpO2: 100%     Objective:    Physical Exam Vitals and nursing note reviewed.  Constitutional:      General: She is not in acute distress. Neck:     Vascular: No JVD.  Cardiovascular:     Rate and Rhythm: Normal rate and regular rhythm.     Heart sounds: Normal heart sounds. No murmur heard. Pulmonary:     Effort: Pulmonary effort is normal.     Breath sounds: Normal breath sounds. No wheezing or  rales.  Musculoskeletal:     Right lower leg: No edema.     Left lower leg: No edema.           Assessment & Recommendations:   82 y.o.  female with hypertension, RA, PAF.  PAF: CHA2DS2VASc score 4. Annual stroke risk 4%. Continue Xarelto 20 mg daily. Discussed importance of risk prevention to reduce falls.  Hypertension: Controlled.   F/u in 1 year.   Patricia Mormon, MD Pager: [phone] Office: [phone]

## 2022-10-07 DIAGNOSIS — E785 Hyperlipidemia, unspecified: Secondary | ICD-10-CM | POA: Diagnosis not present

## 2022-10-07 DIAGNOSIS — G309 Alzheimer's disease, unspecified: Secondary | ICD-10-CM | POA: Diagnosis not present

## 2022-10-12 DIAGNOSIS — Z0000 Encounter for general adult medical examination without abnormal findings: Secondary | ICD-10-CM | POA: Diagnosis not present

## 2022-10-13 DIAGNOSIS — R7301 Impaired fasting glucose: Secondary | ICD-10-CM | POA: Diagnosis not present

## 2022-10-13 DIAGNOSIS — E559 Vitamin D deficiency, unspecified: Secondary | ICD-10-CM | POA: Diagnosis not present

## 2022-10-13 DIAGNOSIS — Z87891 Personal history of nicotine dependence: Secondary | ICD-10-CM | POA: Diagnosis not present

## 2022-10-13 DIAGNOSIS — Z7983 Long term (current) use of bisphosphonates: Secondary | ICD-10-CM | POA: Diagnosis not present

## 2022-10-13 DIAGNOSIS — I10 Essential (primary) hypertension: Secondary | ICD-10-CM | POA: Diagnosis not present

## 2022-10-13 DIAGNOSIS — J309 Allergic rhinitis, unspecified: Secondary | ICD-10-CM | POA: Diagnosis not present

## 2022-10-13 DIAGNOSIS — E785 Hyperlipidemia, unspecified: Secondary | ICD-10-CM | POA: Diagnosis not present

## 2022-10-13 DIAGNOSIS — M069 Rheumatoid arthritis, unspecified: Secondary | ICD-10-CM | POA: Diagnosis not present

## 2022-10-13 DIAGNOSIS — R110 Nausea: Secondary | ICD-10-CM | POA: Diagnosis not present

## 2022-10-13 DIAGNOSIS — M810 Age-related osteoporosis without current pathological fracture: Secondary | ICD-10-CM | POA: Diagnosis not present

## 2022-10-13 DIAGNOSIS — G3184 Mild cognitive impairment, so stated: Secondary | ICD-10-CM | POA: Diagnosis not present

## 2022-10-13 DIAGNOSIS — I4891 Unspecified atrial fibrillation: Secondary | ICD-10-CM | POA: Diagnosis not present

## 2022-10-26 DIAGNOSIS — H35371 Puckering of macula, right eye: Secondary | ICD-10-CM | POA: Diagnosis not present

## 2022-11-04 DIAGNOSIS — J019 Acute sinusitis, unspecified: Secondary | ICD-10-CM | POA: Diagnosis not present

## 2022-11-04 DIAGNOSIS — J302 Other seasonal allergic rhinitis: Secondary | ICD-10-CM | POA: Diagnosis not present

## 2022-11-04 DIAGNOSIS — Z87891 Personal history of nicotine dependence: Secondary | ICD-10-CM | POA: Diagnosis not present

## 2022-11-11 ENCOUNTER — Other Ambulatory Visit: Payer: Self-pay | Admitting: Cardiology

## 2022-12-11 DIAGNOSIS — G309 Alzheimer's disease, unspecified: Secondary | ICD-10-CM | POA: Diagnosis not present

## 2022-12-23 DIAGNOSIS — I10 Essential (primary) hypertension: Secondary | ICD-10-CM | POA: Diagnosis not present

## 2022-12-23 DIAGNOSIS — M5442 Lumbago with sciatica, left side: Secondary | ICD-10-CM | POA: Diagnosis not present

## 2023-01-11 DIAGNOSIS — R5382 Chronic fatigue, unspecified: Secondary | ICD-10-CM | POA: Diagnosis not present

## 2023-01-11 DIAGNOSIS — M79642 Pain in left hand: Secondary | ICD-10-CM | POA: Diagnosis not present

## 2023-01-11 DIAGNOSIS — R7989 Other specified abnormal findings of blood chemistry: Secondary | ICD-10-CM | POA: Diagnosis not present

## 2023-01-11 DIAGNOSIS — M1991 Primary osteoarthritis, unspecified site: Secondary | ICD-10-CM | POA: Diagnosis not present

## 2023-01-11 DIAGNOSIS — M0579 Rheumatoid arthritis with rheumatoid factor of multiple sites without organ or systems involvement: Secondary | ICD-10-CM | POA: Diagnosis not present

## 2023-01-11 DIAGNOSIS — M5136 Other intervertebral disc degeneration, lumbar region: Secondary | ICD-10-CM | POA: Diagnosis not present

## 2023-01-11 DIAGNOSIS — M79641 Pain in right hand: Secondary | ICD-10-CM | POA: Diagnosis not present

## 2023-01-11 DIAGNOSIS — Z79899 Other long term (current) drug therapy: Secondary | ICD-10-CM | POA: Diagnosis not present

## 2023-01-19 DIAGNOSIS — M545 Low back pain, unspecified: Secondary | ICD-10-CM | POA: Diagnosis not present

## 2023-01-19 DIAGNOSIS — I700 Atherosclerosis of aorta: Secondary | ICD-10-CM | POA: Diagnosis not present

## 2023-01-19 DIAGNOSIS — M5451 Vertebrogenic low back pain: Secondary | ICD-10-CM | POA: Diagnosis not present

## 2023-01-26 DIAGNOSIS — M545 Low back pain, unspecified: Secondary | ICD-10-CM | POA: Diagnosis not present

## 2023-01-26 DIAGNOSIS — M5416 Radiculopathy, lumbar region: Secondary | ICD-10-CM | POA: Diagnosis not present

## 2023-01-28 DIAGNOSIS — M545 Low back pain, unspecified: Secondary | ICD-10-CM | POA: Diagnosis not present

## 2023-01-28 DIAGNOSIS — M5416 Radiculopathy, lumbar region: Secondary | ICD-10-CM | POA: Diagnosis not present

## 2023-02-01 DIAGNOSIS — M5416 Radiculopathy, lumbar region: Secondary | ICD-10-CM | POA: Diagnosis not present

## 2023-02-01 DIAGNOSIS — M545 Low back pain, unspecified: Secondary | ICD-10-CM | POA: Diagnosis not present

## 2023-02-04 DIAGNOSIS — M5416 Radiculopathy, lumbar region: Secondary | ICD-10-CM | POA: Diagnosis not present

## 2023-02-04 DIAGNOSIS — M545 Low back pain, unspecified: Secondary | ICD-10-CM | POA: Diagnosis not present

## 2023-02-08 DIAGNOSIS — M5416 Radiculopathy, lumbar region: Secondary | ICD-10-CM | POA: Diagnosis not present

## 2023-02-08 DIAGNOSIS — M545 Low back pain, unspecified: Secondary | ICD-10-CM | POA: Diagnosis not present

## 2023-02-16 DIAGNOSIS — M5416 Radiculopathy, lumbar region: Secondary | ICD-10-CM | POA: Diagnosis not present

## 2023-02-16 DIAGNOSIS — M545 Low back pain, unspecified: Secondary | ICD-10-CM | POA: Diagnosis not present

## 2023-02-24 DIAGNOSIS — M5451 Vertebrogenic low back pain: Secondary | ICD-10-CM | POA: Diagnosis not present

## 2023-02-24 DIAGNOSIS — M5416 Radiculopathy, lumbar region: Secondary | ICD-10-CM | POA: Diagnosis not present

## 2023-03-11 DIAGNOSIS — M545 Low back pain, unspecified: Secondary | ICD-10-CM | POA: Diagnosis not present

## 2023-03-28 DIAGNOSIS — M48062 Spinal stenosis, lumbar region with neurogenic claudication: Secondary | ICD-10-CM | POA: Diagnosis not present

## 2023-04-03 ENCOUNTER — Ambulatory Visit: Payer: Self-pay

## 2023-04-03 DIAGNOSIS — J069 Acute upper respiratory infection, unspecified: Secondary | ICD-10-CM | POA: Diagnosis not present

## 2023-04-07 ENCOUNTER — Other Ambulatory Visit: Payer: Self-pay | Admitting: Cardiology

## 2023-04-07 DIAGNOSIS — I480 Paroxysmal atrial fibrillation: Secondary | ICD-10-CM

## 2023-04-07 NOTE — Telephone Encounter (Signed)
Prescription refill request for Xarelto received.  Indication: a fib Last office visit: Weight: 147 Age:82 Scr: 0.84 [facility] 7/24 CrCl: 54 ml/min

## 2023-04-08 DIAGNOSIS — E785 Hyperlipidemia, unspecified: Secondary | ICD-10-CM | POA: Diagnosis not present

## 2023-04-08 DIAGNOSIS — E559 Vitamin D deficiency, unspecified: Secondary | ICD-10-CM | POA: Diagnosis not present

## 2023-04-08 DIAGNOSIS — R7301 Impaired fasting glucose: Secondary | ICD-10-CM | POA: Diagnosis not present

## 2023-04-14 DIAGNOSIS — E559 Vitamin D deficiency, unspecified: Secondary | ICD-10-CM | POA: Diagnosis not present

## 2023-04-14 DIAGNOSIS — E785 Hyperlipidemia, unspecified: Secondary | ICD-10-CM | POA: Diagnosis not present

## 2023-04-14 DIAGNOSIS — M810 Age-related osteoporosis without current pathological fracture: Secondary | ICD-10-CM | POA: Diagnosis not present

## 2023-04-14 DIAGNOSIS — R7303 Prediabetes: Secondary | ICD-10-CM | POA: Diagnosis not present

## 2023-04-14 DIAGNOSIS — I10 Essential (primary) hypertension: Secondary | ICD-10-CM | POA: Diagnosis not present

## 2023-04-14 DIAGNOSIS — J309 Allergic rhinitis, unspecified: Secondary | ICD-10-CM | POA: Diagnosis not present

## 2023-04-14 DIAGNOSIS — I4891 Unspecified atrial fibrillation: Secondary | ICD-10-CM | POA: Diagnosis not present

## 2023-04-14 DIAGNOSIS — Z87891 Personal history of nicotine dependence: Secondary | ICD-10-CM | POA: Diagnosis not present

## 2023-04-14 DIAGNOSIS — M069 Rheumatoid arthritis, unspecified: Secondary | ICD-10-CM | POA: Diagnosis not present

## 2023-04-19 DIAGNOSIS — R5382 Chronic fatigue, unspecified: Secondary | ICD-10-CM | POA: Diagnosis not present

## 2023-04-19 DIAGNOSIS — M1991 Primary osteoarthritis, unspecified site: Secondary | ICD-10-CM | POA: Diagnosis not present

## 2023-04-19 DIAGNOSIS — M0579 Rheumatoid arthritis with rheumatoid factor of multiple sites without organ or systems involvement: Secondary | ICD-10-CM | POA: Diagnosis not present

## 2023-04-19 DIAGNOSIS — R7989 Other specified abnormal findings of blood chemistry: Secondary | ICD-10-CM | POA: Diagnosis not present

## 2023-04-19 DIAGNOSIS — M79641 Pain in right hand: Secondary | ICD-10-CM | POA: Diagnosis not present

## 2023-04-19 DIAGNOSIS — Z79899 Other long term (current) drug therapy: Secondary | ICD-10-CM | POA: Diagnosis not present

## 2023-04-19 DIAGNOSIS — M2550 Pain in unspecified joint: Secondary | ICD-10-CM | POA: Diagnosis not present

## 2023-04-19 DIAGNOSIS — M79642 Pain in left hand: Secondary | ICD-10-CM | POA: Diagnosis not present

## 2023-04-20 DIAGNOSIS — M5416 Radiculopathy, lumbar region: Secondary | ICD-10-CM | POA: Diagnosis not present

## 2023-05-11 DIAGNOSIS — Z79899 Other long term (current) drug therapy: Secondary | ICD-10-CM | POA: Diagnosis not present

## 2023-05-11 DIAGNOSIS — I4891 Unspecified atrial fibrillation: Secondary | ICD-10-CM | POA: Diagnosis not present

## 2023-05-11 DIAGNOSIS — R059 Cough, unspecified: Secondary | ICD-10-CM | POA: Diagnosis not present

## 2023-05-11 DIAGNOSIS — G3184 Mild cognitive impairment, so stated: Secondary | ICD-10-CM | POA: Diagnosis not present

## 2023-05-11 DIAGNOSIS — Z87891 Personal history of nicotine dependence: Secondary | ICD-10-CM | POA: Diagnosis not present

## 2023-06-14 DIAGNOSIS — G309 Alzheimer's disease, unspecified: Secondary | ICD-10-CM | POA: Diagnosis not present

## 2023-07-06 ENCOUNTER — Other Ambulatory Visit: Payer: Self-pay | Admitting: Cardiology

## 2023-07-06 DIAGNOSIS — I480 Paroxysmal atrial fibrillation: Secondary | ICD-10-CM

## 2023-07-08 NOTE — Telephone Encounter (Signed)
 Prescription refill request for Xarelto received.  Indication:  Last office visit:08/03/22 (Patwardhan)  Weight: 66.7kg Age: 83 Scr: 0.89 (10/07/22)  CrCl: 51.3ml/min  Appropriate dose. Refill sent.

## 2023-07-15 DIAGNOSIS — M5416 Radiculopathy, lumbar region: Secondary | ICD-10-CM | POA: Diagnosis not present

## 2023-08-04 ENCOUNTER — Ambulatory Visit: Payer: Self-pay | Admitting: Cardiology

## 2023-08-09 DIAGNOSIS — H26493 Other secondary cataract, bilateral: Secondary | ICD-10-CM | POA: Diagnosis not present

## 2023-08-16 DIAGNOSIS — H26493 Other secondary cataract, bilateral: Secondary | ICD-10-CM | POA: Diagnosis not present

## 2023-09-06 DIAGNOSIS — H26493 Other secondary cataract, bilateral: Secondary | ICD-10-CM | POA: Diagnosis not present

## 2023-09-13 DIAGNOSIS — J069 Acute upper respiratory infection, unspecified: Secondary | ICD-10-CM | POA: Diagnosis not present

## 2023-09-15 DIAGNOSIS — M0579 Rheumatoid arthritis with rheumatoid factor of multiple sites without organ or systems involvement: Secondary | ICD-10-CM | POA: Diagnosis not present

## 2023-09-15 DIAGNOSIS — R5382 Chronic fatigue, unspecified: Secondary | ICD-10-CM | POA: Diagnosis not present

## 2023-09-16 ENCOUNTER — Emergency Department (HOSPITAL_COMMUNITY)

## 2023-09-16 ENCOUNTER — Other Ambulatory Visit: Payer: Self-pay

## 2023-09-16 ENCOUNTER — Emergency Department (HOSPITAL_COMMUNITY)
Admission: EM | Admit: 2023-09-16 | Discharge: 2023-09-16 | Disposition: A | Discharge status: Home / Self Care | Attending: Emergency Medicine | Admitting: Emergency Medicine

## 2023-09-16 DIAGNOSIS — J101 Influenza due to other identified influenza virus with other respiratory manifestations: Secondary | ICD-10-CM | POA: Diagnosis not present

## 2023-09-16 DIAGNOSIS — G309 Alzheimer's disease, unspecified: Secondary | ICD-10-CM | POA: Insufficient documentation

## 2023-09-16 DIAGNOSIS — R051 Acute cough: Secondary | ICD-10-CM | POA: Diagnosis not present

## 2023-09-16 DIAGNOSIS — R0602 Shortness of breath: Secondary | ICD-10-CM | POA: Diagnosis not present

## 2023-09-16 DIAGNOSIS — I4891 Unspecified atrial fibrillation: Secondary | ICD-10-CM | POA: Diagnosis not present

## 2023-09-16 DIAGNOSIS — I10 Essential (primary) hypertension: Secondary | ICD-10-CM | POA: Insufficient documentation

## 2023-09-16 DIAGNOSIS — Z7901 Long term (current) use of anticoagulants: Secondary | ICD-10-CM | POA: Insufficient documentation

## 2023-09-16 DIAGNOSIS — Z79899 Other long term (current) drug therapy: Secondary | ICD-10-CM | POA: Insufficient documentation

## 2023-09-16 LAB — RESP PANEL BY RT-PCR (RSV, FLU A&B, COVID)  RVPGX2
Influenza A by PCR: POSITIVE — AB
Influenza B by PCR: NEGATIVE
Resp Syncytial Virus by PCR: NEGATIVE
SARS Coronavirus 2 by RT PCR: NEGATIVE

## 2023-09-16 LAB — BASIC METABOLIC PANEL
Anion gap: 14 (ref 5–15)
BUN: 14 mg/dL (ref 8–23)
CO2: 24 mmol/L (ref 22–32)
Calcium: 9 mg/dL (ref 8.9–10.3)
Chloride: 100 mmol/L (ref 98–111)
Creatinine, Ser: 0.85 mg/dL (ref 0.44–1.00)
GFR, Estimated: >60 mL/min (ref >60)
Glucose, Bld: 87 mg/dL (ref 70–99)
Potassium: 3.9 mmol/L (ref 3.5–5.1)
Sodium: 138 mmol/L (ref 135–145)

## 2023-09-16 LAB — CBC
HCT: 39.5 % (ref 36.0–46.0)
Hemoglobin: 13.1 g/dL (ref 12.0–15.0)
MCH: 29.8 pg (ref 26.0–34.0)
MCHC: 33.2 g/dL (ref 30.0–36.0)
MCV: 90 fL (ref 80.0–100.0)
Platelets: 241 10*3/uL (ref 150–400)
RBC: 4.39 MIL/uL (ref 3.87–5.11)
RDW: 14.1 % (ref 11.5–15.5)
WBC: 8.1 10*3/uL (ref 4.0–10.5)
nRBC: 0 % (ref 0.0–0.2)

## 2023-09-16 MED ORDER — BENZONATATE 100 MG PO CAPS: 100.0000 mg | ORAL_CAPSULE | Freq: Three times a day (TID) | ORAL | 0 refills | Status: AC

## 2023-09-16 NOTE — ED Triage Notes (Signed)
 Granddaughter stated, pt was dx with pneumonia last week and given a Z-Pak, its not helping. She is SOB. Dizzy, her Fit watch is reading A-Fib. Oxygen low

## 2023-09-16 NOTE — ED Notes (Signed)
 Awaiting patient from xray.

## 2023-09-16 NOTE — Discharge Instructions (Addendum)
 Your COVID, flu, and RSV testing still pending.  You can review these results on your MyChart portal.   Since you tested positive for the flu couple days ago, I suspect your symptoms are secondary to flu. Cough can linger for a couple weeks.   You may continue on the Tamiflu as prescribed by urgent care.  You may discontinue the azithromycin.  Your blood counts, electrolytes, kidney function were normal here today.  Your EKG showed normal sinus rhythm, no atrial fibrillation currently (although it is possible you could be going in and out of a-fib)  You have been prescribed a cough medication called Tessalon Perles to use up to every 8 hours as needed for cough  Home care instructions:  You can take Tylenol and/or Ibuprofen as directed on the packaging for fever reduction and pain relief.    For cough: honey 1/2 to 1 teaspoon (you can dilute the honey in water or another fluid).  You can also use guaifenesin and dextromethorphan for cough which are over-the-counter medications. You can use a humidifier for chest congestion and cough.  If you don't have a humidifier, you can sit in the bathroom with the hot shower running.      For sore throat: try warm salt water gargles, cepacol lozenges, throat spray, warm tea or water with lemon/honey, popsicles or ice, or OTC cold relief medicine for throat discomfort.    For congestion: Flonase 1-2 sprays in each nostril daily.    It is important to stay hydrated: drink plenty of fluids (water, gatorade/powerade/pedialyte, juices, or teas) to keep your throat moisturized and help further relieve irritation/discomfort.   Your illness is contagious and can be spread to others, especially during the first 3 or 4 days. It cannot be cured by antibiotics or other medicines. Take basic precautions such as wearing a mask, washing your hands often, covering your mouth when you cough or sneeze, and avoiding public places where you could spread your illness to others.     Follow-up instructions: Please follow-up with your primary care provider for further evaluation of your symptoms if you are not feeling better within the next 5 days.  Return instructions:  Please return to the Emergency Department if you experience worsening symptoms.  RETURN IMMEDIATELY IF you develop shortness of breath, confusion or altered mental status, a new rash, become dizzy, faint, or poorly responsive, or are unable to be cared for at home. Please return if you have persistent vomiting and cannot keep down fluids or develop a fever that is not controlled by tylenol or motrin.   Please return if you have any other emergent concerns.

## 2023-09-16 NOTE — ED Provider Notes (Cosign Needed Addendum)
 [place] EMERGENCY DEPARTMENT AT [facility] Provider Note   CSN: [id number] Arrival date & time: 09/16/23  1018     History  Chief Complaint  Patient presents with   Shortness of Breath   Dizziness   Cough   Nasal Congestion   Chest Pain    Patricia Clark is a 83 y.o. female with history of A-fib on Xarelto, hypertension, Alzheimer's, presents with concern for dry cough and feeling short of breath for the past 3 days.  States she was evaluated at urgent care when this first started, was diagnosed with influenza A and pneumonia.  She was started on a Z-Pak and Tamiflu, but has not noticed improvement in symptoms.  She reports some pain in her upper chest when she coughs. No pleuritic chest pain or exertional chest pain. No nausea or vomiting, but has had decreased appetite  History provided by both patient and her daughter at bedside   Shortness of Breath Associated symptoms: chest pain and cough   Dizziness Associated symptoms: chest pain and shortness of breath   Cough Associated symptoms: chest pain and shortness of breath   Chest Pain Associated symptoms: cough, dizziness and shortness of breath        Home Medications Prior to Admission medications   Medication Sig Start Date End Date Taking? Authorizing Provider  alendronate (FOSAMAX) 70 MG tablet Take 70 mg by mouth once a week. Tuesday   Yes [provider]  Ascorbic Acid (VITAMIN C) 500 MG CAPS Take 500 mg by mouth daily.   Yes [provider]  azithromycin (ZITHROMAX) 250 MG tablet Take 250-500 mg by mouth See admin instructions. Take two tablets by mouth on the first day of course. For the following four days take one tablet by mouth in the morning. 09/13/23  Yes [provider]  benzonatate (TESSALON) 100 MG capsule Take 1 capsule (100 mg total) by mouth every 8 (eight) hours. 09/16/23  Yes Arabella Merles, PA-C  Cholecalciferol 1.25 MG (50000 UT) capsule Take 1 capsule by  mouth once a week. Thursday   Yes [provider]  donepezil (ARICEPT) 10 MG tablet Take 10 mg by mouth at bedtime.   Yes [provider]  ENBREL SURECLICK 50 MG/ML injection Inject 50 mg into the skin once a week. 02/08/21  Yes [provider]  folic acid (FOLVITE) 1 MG tablet Take 1 mg by mouth daily.   Yes [provider]  hydroxychloroquine (PLAQUENIL) 200 MG tablet Take 200 mg by mouth 2 (two) times daily. 04/02/20  Yes [provider]  KAPSPARGO SPRINKLE 50 MG CS24 Take 1 capsule by mouth at bedtime.   Yes [provider]  memantine (NAMENDA) 5 MG tablet Take 5 mg by mouth daily.   Yes [provider]  oseltamivir (TAMIFLU) 75 MG capsule Take 75 mg by mouth 2 (two) times daily. 09/13/23  Yes [provider]  Potassium Chloride ER 20 MEQ TBCR Take 1 tablet by mouth daily.   Yes [provider]  pramipexole (MIRAPEX) 0.125 MG tablet Take 0.125 mg by mouth 2 (two) times daily.  08/04/18  Yes [provider]  QUEtiapine (SEROQUEL) 25 MG tablet Take 25 mg by mouth at bedtime.   Yes [provider]  rivaroxaban (XARELTO) 20 MG TABS tablet TAKE 1 TABLET BY MOUTH IN THE EVENING Patient taking differently: Take 20 mg by mouth daily. 07/08/23  Yes Patwardhan, Manish J, MD  simvastatin (ZOCOR) 20 MG tablet TAKE 1 TABLET  BY MOUTH EVERY DAY Patient taking differently: Take 20 mg by mouth daily. 06/24/20  Yes Patwardhan, Manish J, MD  spironolactone (ALDACTONE) 25 MG tablet TAKE 1 TABLET BY MOUTH EVERY DAY Patient taking differently: Take 25 mg by mouth daily. 06/24/20  Yes Patwardhan, Manish J, MD  metoprolol succinate (TOPROL-XL) 50 MG 24 hr tablet TAKE 1 TABLET (50 MG TOTAL) BY MOUTH DAILY. TAKE WITH OR IMMEDIATELY FOLLOWING A MEAL. 06/24/20   Patwardhan, Anabel Bene, MD      Allergies    Macrobid [nitrofurantoin]    Review of Systems   Review of Systems  Respiratory:  Positive for cough and shortness of  breath.   Cardiovascular:  Positive for chest pain.  Neurological:  Positive for dizziness.    Physical Exam Updated Vital Signs BP 113/70   Pulse (!) 57   Temp 98.1 F (36.7 C)   Resp (!) 26   Ht 5\' 5"  (1.651 m)   Wt 59.9 kg   SpO2 94%   BMI 21.97 kg/m  Physical Exam Vitals and nursing note reviewed.  Constitutional:      General: She is not in acute distress.    Appearance: She is well-developed.  HENT:     Head: Normocephalic and atraumatic.  Eyes:     Conjunctiva/sclera: Conjunctivae normal.  Cardiovascular:     Rate and Rhythm: Normal rate and regular rhythm.     Heart sounds: No murmur heard.    Comments: 2+ dorsalis pedis pulse bilaterally, 2+ radial pulse bilaterally Pulmonary:     Effort: Pulmonary effort is normal. No respiratory distress.     Comments: Talking in full sentences on room air without difficulty Adventitious sounds in lung fields diffusely Abdominal:     Palpations: Abdomen is soft.     Tenderness: There is no abdominal tenderness.  Musculoskeletal:        General: No swelling.     Cervical back: Neck supple.     Comments: Mild nonpitting edema in the bilateral feet, patient and daughter report this is her baseline  Skin:    General: Skin is warm and dry.     Capillary Refill: Capillary refill takes less than 2 seconds.  Neurological:     Mental Status: She is alert.  Psychiatric:        Mood and Affect: Mood normal.     ED Results / Procedures / Treatments   Labs (all labs ordered are listed, but only abnormal results are displayed) Labs Reviewed  RESP PANEL BY RT-PCR (RSV, FLU A&B, COVID)  [id number] - Abnormal; Notable for the following components:      Result Value   Influenza A by PCR POSITIVE (*)    All other components within normal limits  BASIC METABOLIC PANEL  CBC    EKG None  Radiology DG Chest 2 View Result Date: 09/16/2023 CLINICAL DATA:  Shortness of breath. EXAM: CHEST - 2 VIEW COMPARISON:  None Available.  FINDINGS: Bilateral lung fields are clear. Bilateral costophrenic angles are clear. Normal cardio-mediastinal silhouette. No acute osseous abnormalities. The soft tissues are within normal limits. IMPRESSION: No active cardiopulmonary disease. Electronically Signed   By: Jules Schick M.D.   On: 09/16/2023 15:07    Procedures Procedures    Medications Ordered in ED Medications - No data to display  ED Course/ Medical Decision Making/ A&P  Medical Decision Making Amount and/or Complexity of Data Reviewed Labs: ordered. Radiology: ordered.  Risk Prescription drug management.     Differential diagnosis includes but is not limited to COVID, flu, RSV, viral URI, strep pharyngitis, viral pharyngitis, allergic rhinitis, pneumonia, bronchitis, ACS, CHF   ED Course:  Patient well-appearing, stable vital signs.  She was initially on nasal cannula when I went into the room, but I took this off, and she remained at 98% on room air.  Was able to talk in full sentences without difficulty.  Lungs with adventitious sounds bilaterally.  Dry cough on exam.  She does report some pain in her chest when she coughs, but no pleuritic or exertional pain.  EKG with normal sinus rhythm and no ST changes, no concern for ACS at this time.  She has mild nonpitting edema of the feet bilaterally, but patient and daughter states this is her baseline.  No concern for CHF at this time. Suspect her symptoms are likely due to her recent flu diagnosis since this was when his symptoms started. I Ordered, and personally interpreted labs.  The pertinent results include:   CBC and BMP within normal limits Respiratory panel pending  I discussed the lab and EKG findings with the patient and family at bedside.  Discussed that chest x-ray did not show pneumonia today.  Influenza A+.  She is already on Tamiflu.  Recommended she continue this for treatment of influenza diagnosed at urgent care.   Suspect her cough and shortness of breath is secondary to her influenza. Low concern for any other emergent cause to her cough at this time.  Stable and appropriate for discharge home.   Impression: Shortness of breath and cough likely secondary to Influenza  Disposition:  Patient discharged home with recommendation to continue Tamiflu as prescribed by urgent care for her influenza A.  I prescribed Tessalon Perles for cough.  Follow-up with PCP if symptoms not improving within the next 5 days.  Return precautions given.   Imaging Studies ordered: I ordered imaging studies including chest x-ray I independently visualized the imaging with scope of interpretation limited to determining acute life threatening conditions related to emergency care. Imaging showed possible consolidation in the right lower lobe.  No pneumothorax or pleural effusion.  Awaiting radiology read  Cardiac Monitoring: / EKG: The patient was maintained on a cardiac monitor.  I personally viewed and interpreted the cardiac monitored which showed an underlying rhythm of: Normal sinus rhythm, no ST changes             Final Clinical Impression(s) / ED Diagnoses Final diagnoses:  Acute cough  Influenza A    Rx / DC Orders ED Discharge Orders          Ordered    benzonatate (TESSALON) 100 MG capsule  Every 8 hours        09/16/23 1537              Arabella Merles, PA-C 09/16/23 1541    Arabella Merles, PA-C 09/16/23 1542    Anders Simmonds T, DO 09/17/23 [phone]

## 2023-09-17 ENCOUNTER — Encounter: Payer: Self-pay | Admitting: Cardiology

## 2023-09-17 ENCOUNTER — Ambulatory Visit: Payer: Medicare Other | Attending: Cardiology | Admitting: Cardiology

## 2023-09-17 ENCOUNTER — Other Ambulatory Visit: Payer: Self-pay

## 2023-09-17 VITALS — BP 116/72 | HR 83 | Ht 66.0 in | Wt 146.0 lb

## 2023-09-17 DIAGNOSIS — I480 Paroxysmal atrial fibrillation: Secondary | ICD-10-CM | POA: Diagnosis not present

## 2023-09-17 MED ORDER — RIVAROXABAN 20 MG PO TABS: 20.0000 mg | ORAL_TABLET | Freq: Every evening | ORAL | 1 refills | Status: DC

## 2023-09-17 MED ORDER — METOPROLOL SUCCINATE ER 50 MG PO TB24: 50.0000 mg | ORAL_TABLET | Freq: Every day | ORAL | 1 refills | Status: DC

## 2023-09-17 NOTE — Patient Instructions (Signed)
Medication Instructions:   Your physician recommends that you continue on your current medications as directed. Please refer to the Current Medication list given to you today.  *If you need a refill on your cardiac medications before your next appointment, please call your pharmacy*    Follow-Up:  AS NEEDED WITH DR. PATWARDHAN

## 2023-09-17 NOTE — Progress Notes (Signed)
  Cardiology Office Note:  .   Date:  09/18/2023  ID:  Patricia Clark, DOB [birth year], MRN [id number] PCP: Henrine Screws, MD  [facility] Providers Cardiologist:  Truett Mainland, MD PCP: Henrine Screws, MD  Chief Complaint  Patient presents with   Atrial Fibrillation   Follow-up    1 year      History of Present Illness: .    Patricia Clark is a 83 y.o. female with hypertension, RA, PAF.  Patient is here today with her daughter. She recently had flu illness and is recovering from it, still has some lingering cough. She was recently diagnosed with Alzhiemer's dementia. From cardiac standpoint, her fitbit alerts her about Afib during sleep, but never dring daytime. This is only been occurring for last few weeks. She denies any chest pain, palpitations.     Vitals:   09/17/23 1615  BP: 116/72  Pulse: 83  SpO2: 92%     ROS:  Review of Systems  Cardiovascular:  Negative for chest pain, dyspnea on exertion, leg swelling, palpitations and syncope.  Respiratory:  Positive for cough.      Studies Reviewed: Marland Kitchen        Independently interpreted EKG 09/16/2023: Normal sinus rhythm Normal ECG  Independently interpreted 09/2023: Hb 13.1 Cr 0.85   Risk Assessment/Calculations:    CHA2DS2-VASc Score = 4  This indicates a 4.8% annual risk of stroke. The patient's score is based upon: CHF History: 0 HTN History: 1 Diabetes History: 0 Stroke History: 0 Vascular Disease History: 0 Age Score: 2 Gender Score: 1      Physical Exam:   Physical Exam Vitals and nursing note reviewed.  Constitutional:      General: She is not in acute distress. Neck:     Vascular: No JVD.  Cardiovascular:     Rate and Rhythm: Normal rate and regular rhythm.     Heart sounds: Normal heart sounds. No murmur heard. Pulmonary:     Effort: Pulmonary effort is normal.     Breath sounds: Normal breath sounds. No wheezing or rales.  Musculoskeletal:     Right lower leg: No edema.      Left lower leg: No edema.      VISIT DIAGNOSES:   ICD-10-CM   1. Paroxysmal atrial fibrillation (HCC)  I48.0 CANCELED: EKG 12-Lead       ASSESSMENT AND PLAN: .    Patricia Clark is a 83 y.o. female with hypertension, RA, PAF, Alzimer's dementia   PAF: Night time Afib episodes as per fitbit, no symptoms reported. It absence of symptoms, patient and daughter would like to pursue conservative approach. Cr cl 63. Continue Xarelto 20 mg daily.   Hypertension: Controlled.         Meds ordered this encounter  Medications   metoprolol succinate (TOPROL-XL) 50 MG 24 hr tablet    Sig: Take 1 tablet (50 mg total) by mouth daily. Take with or immediately following a meal.    Dispense:  90 tablet    Refill:  1    Further refills should come from PCP after this supply runs out.    Given stable PAF and no active symptoms, I will see has needed in future. Please feel free to reach out to me, in case of any questions.   Signed, Elder Negus, MD

## 2023-09-17 NOTE — Telephone Encounter (Signed)
 Prescription refill request for Xarelto received.  Indication:afib Last office visit:3/25 Weight:66.2  kg Age:83 Scr:0.85  3/25 CrCl:53.33  ml/min  Prescription refilled

## 2023-09-18 ENCOUNTER — Encounter: Payer: Self-pay | Admitting: Cardiology

## 2023-10-14 DIAGNOSIS — E559 Vitamin D deficiency, unspecified: Secondary | ICD-10-CM | POA: Diagnosis not present

## 2023-10-14 DIAGNOSIS — E785 Hyperlipidemia, unspecified: Secondary | ICD-10-CM | POA: Diagnosis not present

## 2023-10-14 DIAGNOSIS — R7303 Prediabetes: Secondary | ICD-10-CM | POA: Diagnosis not present

## 2023-10-26 DIAGNOSIS — M79641 Pain in right hand: Secondary | ICD-10-CM | POA: Diagnosis not present

## 2023-10-26 DIAGNOSIS — M0579 Rheumatoid arthritis with rheumatoid factor of multiple sites without organ or systems involvement: Secondary | ICD-10-CM | POA: Diagnosis not present

## 2023-10-26 DIAGNOSIS — R5382 Chronic fatigue, unspecified: Secondary | ICD-10-CM | POA: Diagnosis not present

## 2023-10-26 DIAGNOSIS — M79642 Pain in left hand: Secondary | ICD-10-CM | POA: Diagnosis not present

## 2023-10-26 DIAGNOSIS — Z79899 Other long term (current) drug therapy: Secondary | ICD-10-CM | POA: Diagnosis not present

## 2023-10-26 DIAGNOSIS — M1991 Primary osteoarthritis, unspecified site: Secondary | ICD-10-CM | POA: Diagnosis not present

## 2023-10-26 DIAGNOSIS — R7989 Other specified abnormal findings of blood chemistry: Secondary | ICD-10-CM | POA: Diagnosis not present

## 2023-11-15 ENCOUNTER — Other Ambulatory Visit (HOSPITAL_COMMUNITY): Payer: Self-pay | Admitting: Nurse Practitioner

## 2023-11-15 DIAGNOSIS — Z0000 Encounter for general adult medical examination without abnormal findings: Secondary | ICD-10-CM | POA: Diagnosis not present

## 2023-11-15 DIAGNOSIS — I10 Essential (primary) hypertension: Secondary | ICD-10-CM | POA: Diagnosis not present

## 2023-11-15 DIAGNOSIS — M810 Age-related osteoporosis without current pathological fracture: Secondary | ICD-10-CM

## 2023-11-15 DIAGNOSIS — E559 Vitamin D deficiency, unspecified: Secondary | ICD-10-CM | POA: Diagnosis not present

## 2023-11-15 DIAGNOSIS — G2581 Restless legs syndrome: Secondary | ICD-10-CM | POA: Diagnosis not present

## 2023-11-22 ENCOUNTER — Ambulatory Visit (HOSPITAL_COMMUNITY)
Admission: RE | Admit: 2023-11-22 | Discharge: 2023-11-22 | Disposition: A | Discharge status: Home / Self Care | Source: Ambulatory Visit | Attending: Nurse Practitioner | Admitting: Nurse Practitioner

## 2023-11-22 DIAGNOSIS — M810 Age-related osteoporosis without current pathological fracture: Secondary | ICD-10-CM | POA: Diagnosis not present

## 2023-11-22 DIAGNOSIS — Z780 Asymptomatic menopausal state: Secondary | ICD-10-CM | POA: Diagnosis not present

## 2023-11-25 DIAGNOSIS — M5416 Radiculopathy, lumbar region: Secondary | ICD-10-CM | POA: Diagnosis not present

## 2024-01-06 IMAGING — DX DG FOREARM 2V*R*
2 series · 2 of 2 positions shown · non-contrast
Comparison: Right wrist radiographs 12/31/2020

CLINICAL DATA: Recent fall landing on right arm. Pain from wrist to
shoulder.

EXAM:
RIGHT WRIST - COMPLETE 3+ VIEW; RIGHT FOREARM - 2 VIEW

[forearm ap]
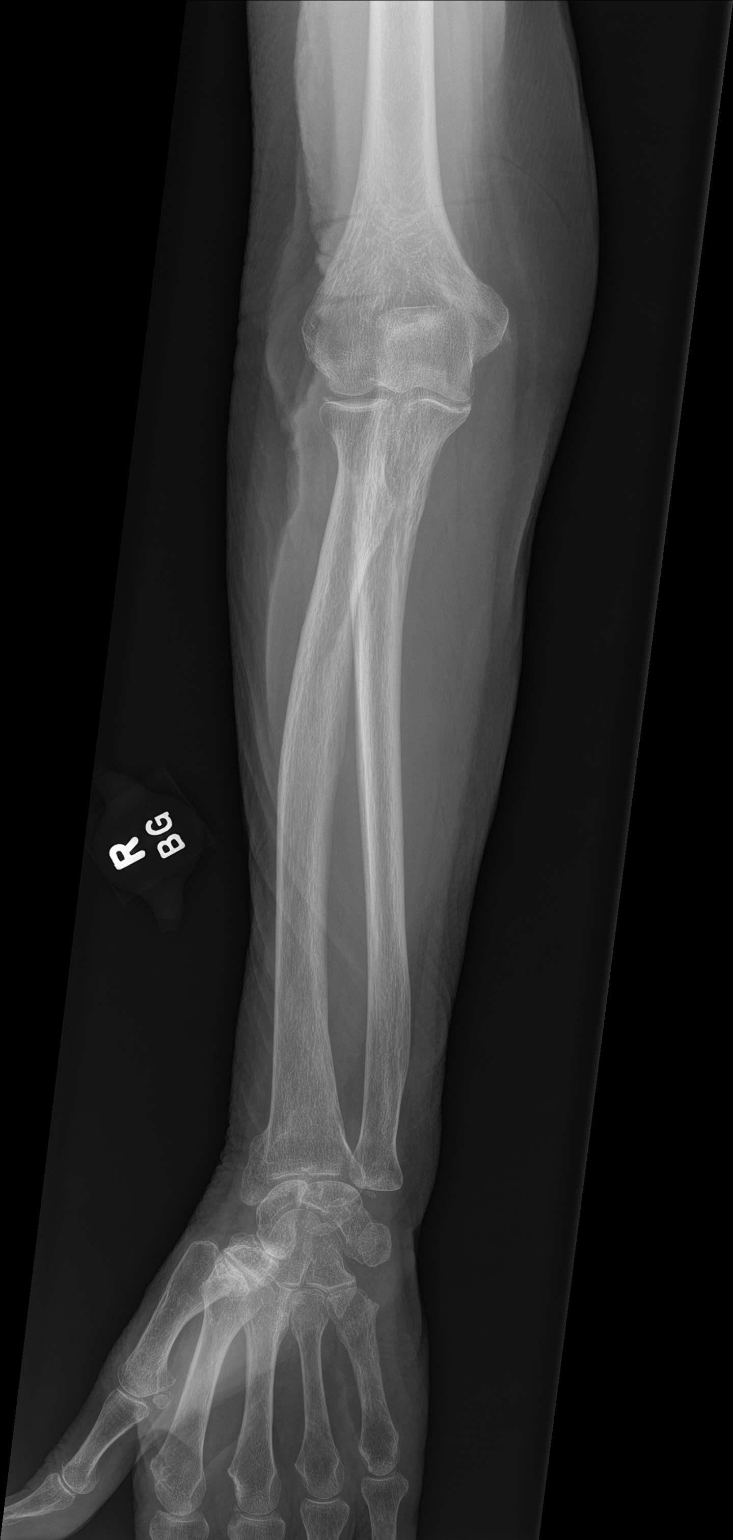

[forearm lat]
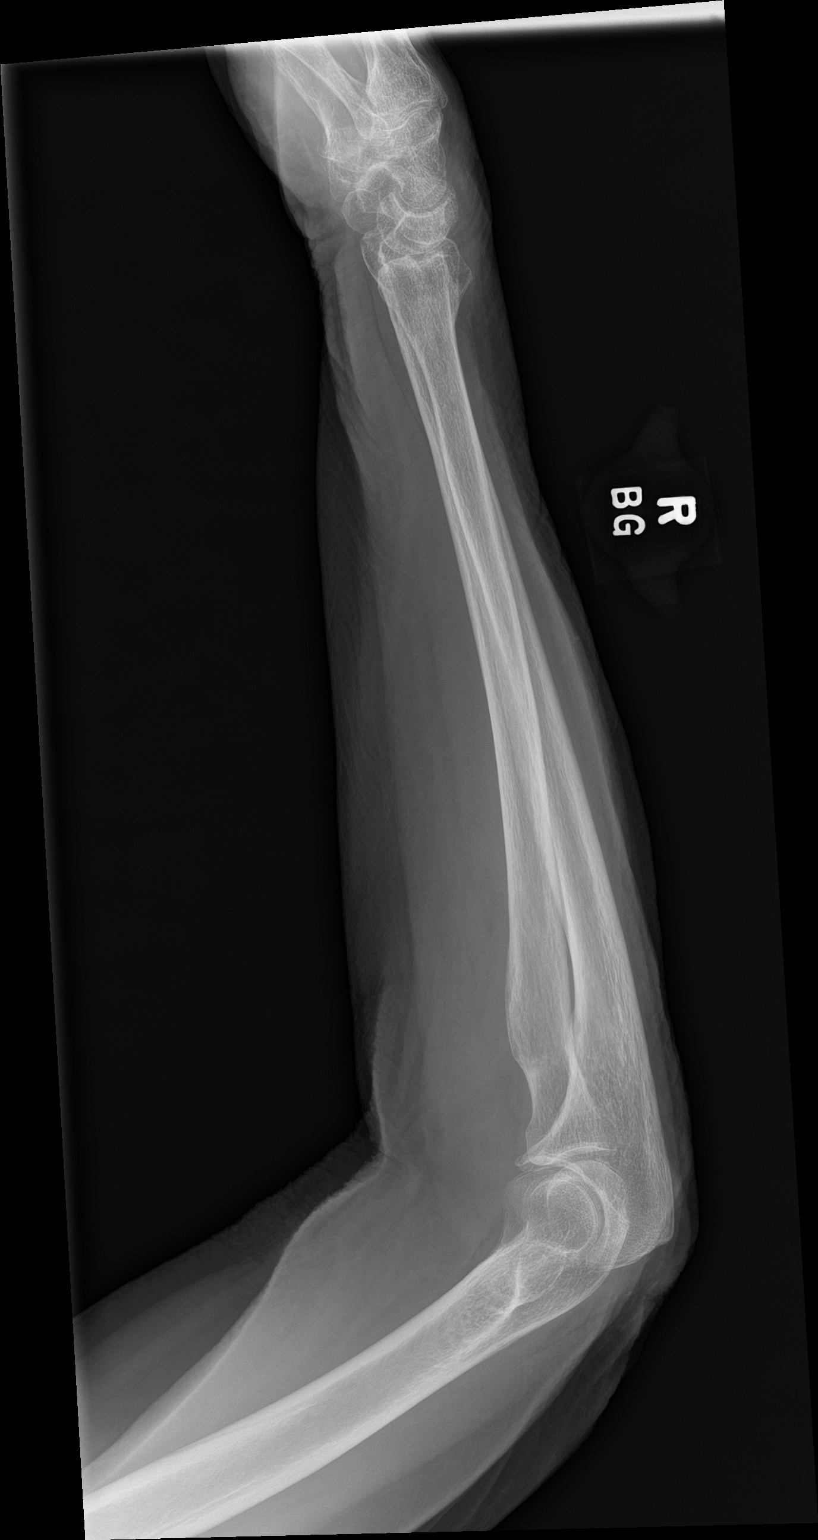

[2 of 2 positions shown; findings below may reference images not displayed]

FINDINGS: There is diffuse decreased bone mineralization. Old healed mildly
impacted fracture of the distal radial metaphysis. Normal alignment.

Moderate radiocarpal joint space narrowing with degenerative cystic
changes at the junction of the scaphoid and lunate, similar to
prior.

Moderate to severe triscaphe and thumb carpometacarpal joint space
narrowing. Moderate peripheral thumb carpometacarpal degenerative
osteophytosis.

Mild ulnar positive variance, unchanged.

Old well corticated nonunited ulnar styloid fracture.

No acute fracture is seen within the right wrist or right forearm.
No dislocation.
IMPRESSION: 1. Moderate to severe thumb carpometacarpal and moderate triscaphe
osteoarthritis.
2. Old healed fracture of the distal radial metaphysis.
3. No acute fracture is seen.

## 2024-01-06 IMAGING — DX DG WRIST COMPLETE 3+V*R*
4 series · 4 of 4 positions shown · non-contrast
Comparison: Right wrist radiographs 12/31/2020

CLINICAL DATA: Recent fall landing on right arm. Pain from wrist to
shoulder.

EXAM:
RIGHT WRIST - COMPLETE 3+ VIEW; RIGHT FOREARM - 2 VIEW

[wrist ap]
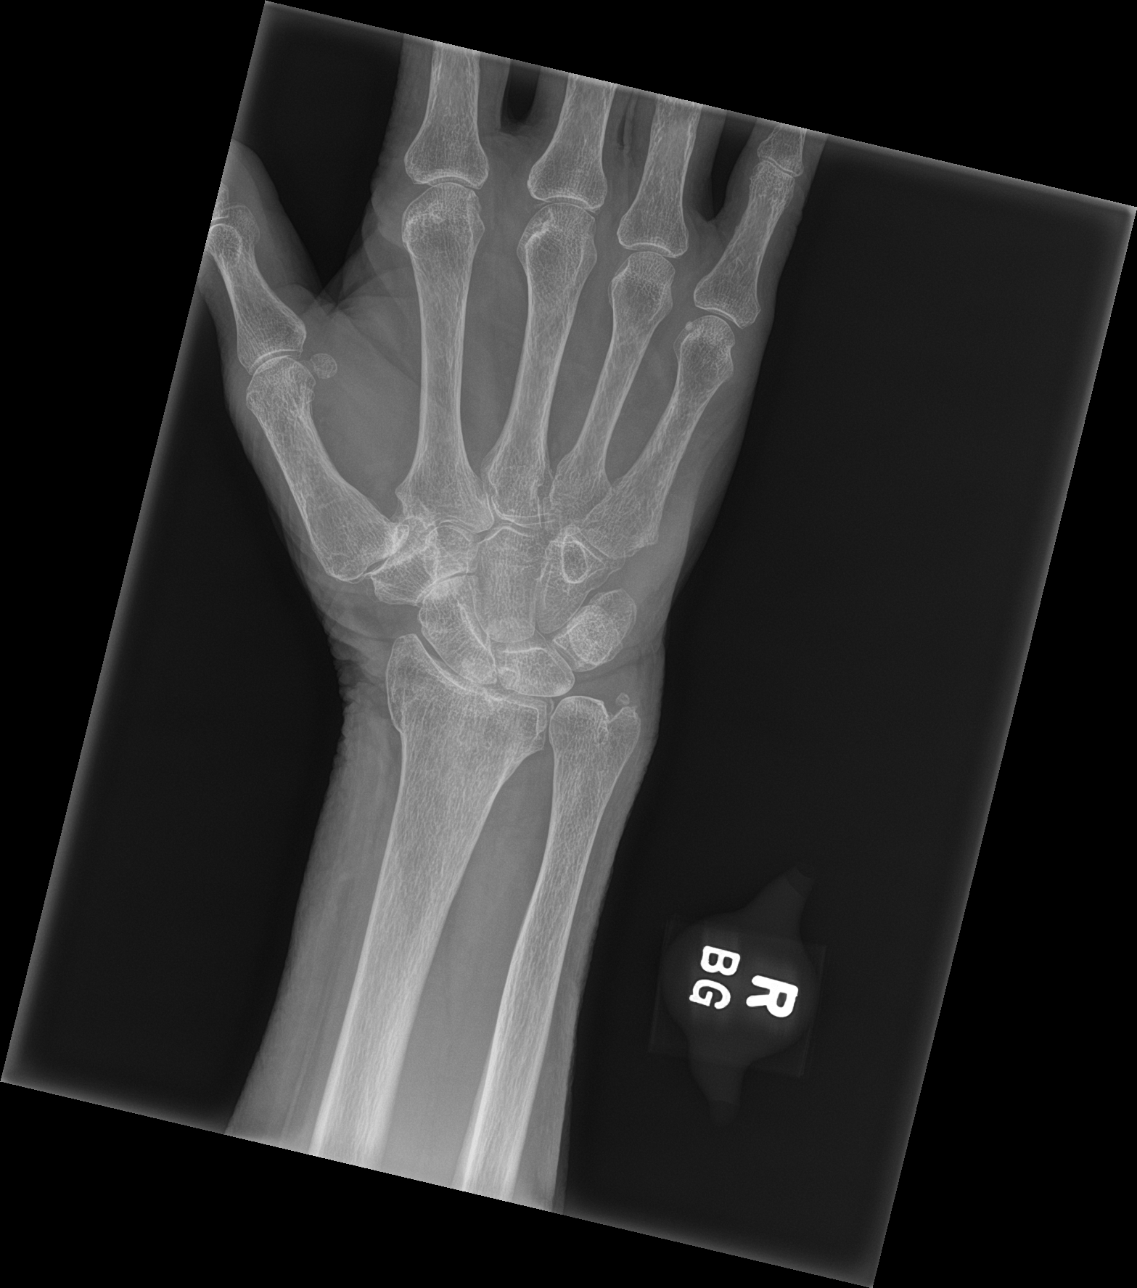

[wrist obl]
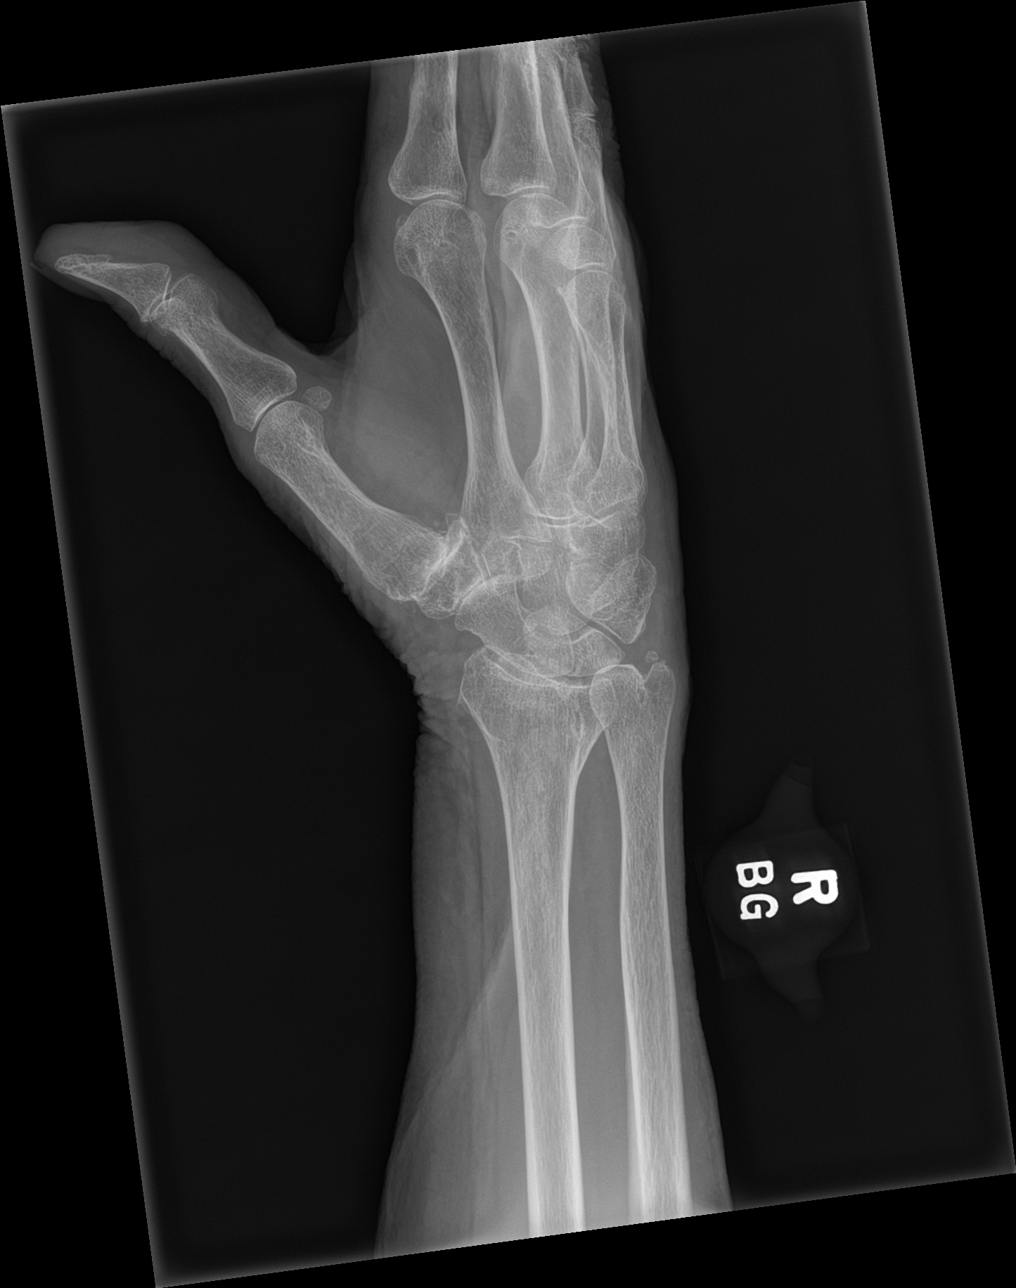

[wrist lat]
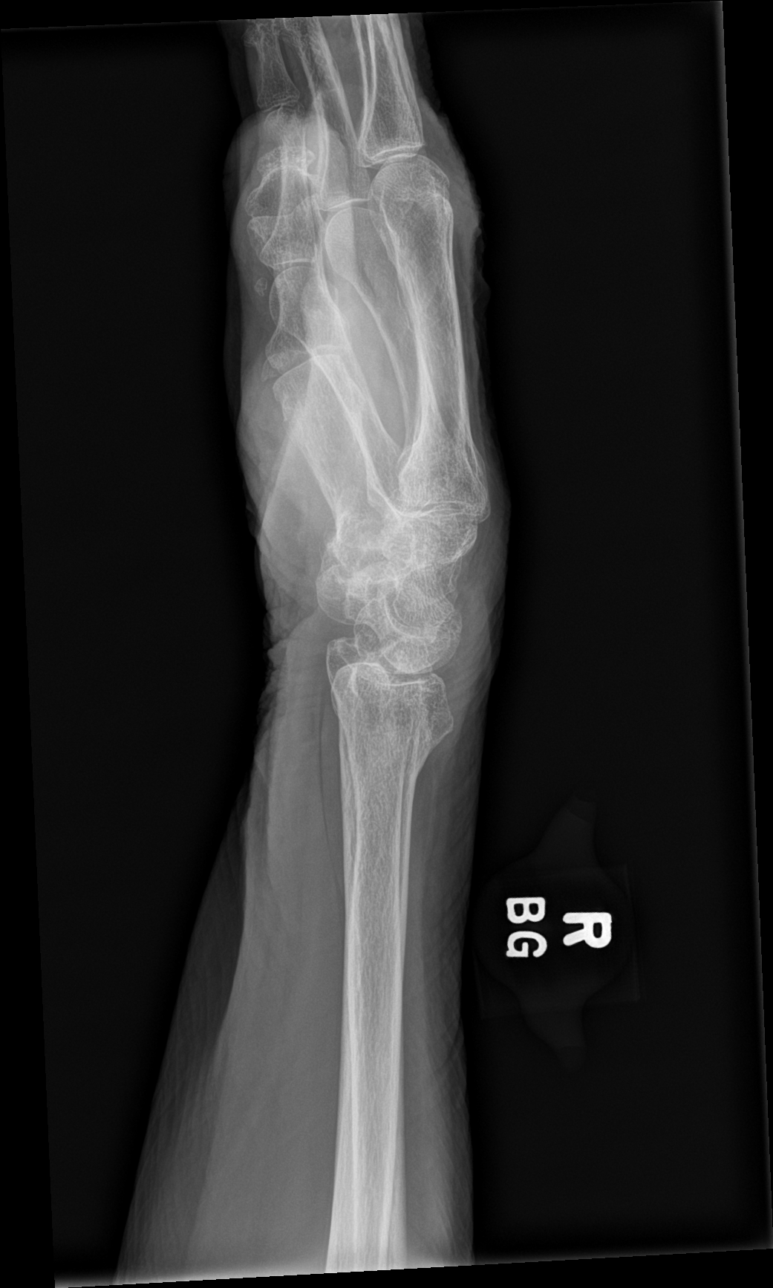

[wrist navicular]
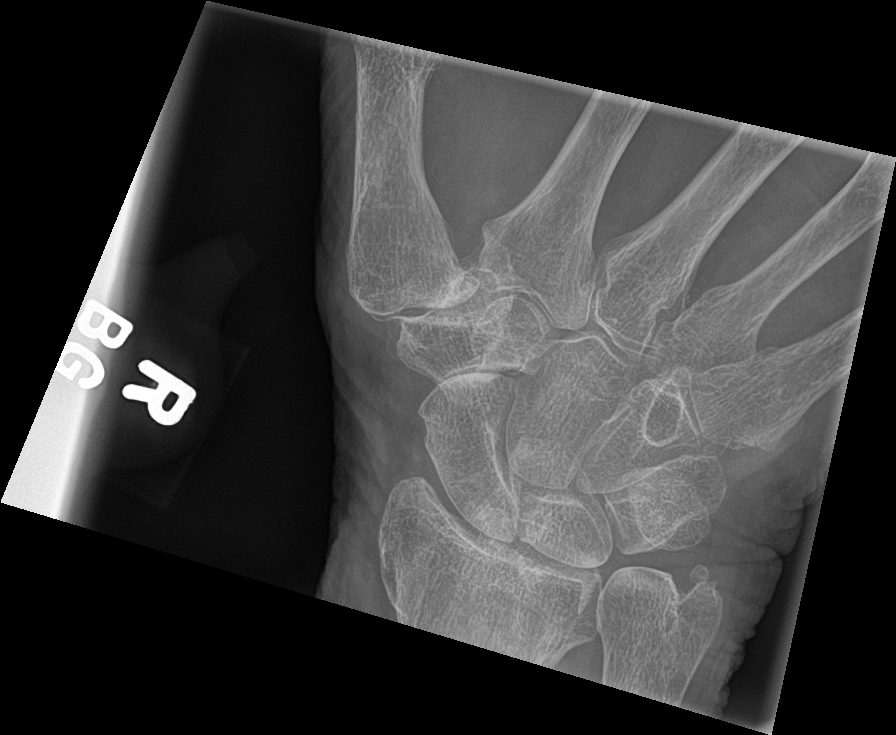

[4 of 4 positions shown; findings below may reference images not displayed]

FINDINGS: There is diffuse decreased bone mineralization. Old healed mildly
impacted fracture of the distal radial metaphysis. Normal alignment.

Moderate radiocarpal joint space narrowing with degenerative cystic
changes at the junction of the scaphoid and lunate, similar to
prior.

Moderate to severe triscaphe and thumb carpometacarpal joint space
narrowing. Moderate peripheral thumb carpometacarpal degenerative
osteophytosis.

Mild ulnar positive variance, unchanged.

Old well corticated nonunited ulnar styloid fracture.

No acute fracture is seen within the right wrist or right forearm.
No dislocation.
IMPRESSION: 1. Moderate to severe thumb carpometacarpal and moderate triscaphe
osteoarthritis.
2. Old healed fracture of the distal radial metaphysis.
3. No acute fracture is seen.

## 2024-02-21 DIAGNOSIS — Z79899 Other long term (current) drug therapy: Secondary | ICD-10-CM | POA: Diagnosis not present

## 2024-02-21 DIAGNOSIS — R5382 Chronic fatigue, unspecified: Secondary | ICD-10-CM | POA: Diagnosis not present

## 2024-02-21 DIAGNOSIS — R7989 Other specified abnormal findings of blood chemistry: Secondary | ICD-10-CM | POA: Diagnosis not present

## 2024-02-21 DIAGNOSIS — M1991 Primary osteoarthritis, unspecified site: Secondary | ICD-10-CM | POA: Diagnosis not present

## 2024-02-21 DIAGNOSIS — M79641 Pain in right hand: Secondary | ICD-10-CM | POA: Diagnosis not present

## 2024-02-21 DIAGNOSIS — M79642 Pain in left hand: Secondary | ICD-10-CM | POA: Diagnosis not present

## 2024-02-21 DIAGNOSIS — M0579 Rheumatoid arthritis with rheumatoid factor of multiple sites without organ or systems involvement: Secondary | ICD-10-CM | POA: Diagnosis not present

## 2024-02-24 DIAGNOSIS — M810 Age-related osteoporosis without current pathological fracture: Secondary | ICD-10-CM | POA: Diagnosis not present

## 2024-02-24 DIAGNOSIS — R7303 Prediabetes: Secondary | ICD-10-CM | POA: Diagnosis not present

## 2024-02-24 DIAGNOSIS — E785 Hyperlipidemia, unspecified: Secondary | ICD-10-CM | POA: Diagnosis not present

## 2024-03-02 DIAGNOSIS — E785 Hyperlipidemia, unspecified: Secondary | ICD-10-CM | POA: Diagnosis not present

## 2024-03-02 DIAGNOSIS — M069 Rheumatoid arthritis, unspecified: Secondary | ICD-10-CM | POA: Diagnosis not present

## 2024-03-02 DIAGNOSIS — E559 Vitamin D deficiency, unspecified: Secondary | ICD-10-CM | POA: Diagnosis not present

## 2024-03-02 DIAGNOSIS — I10 Essential (primary) hypertension: Secondary | ICD-10-CM | POA: Diagnosis not present

## 2024-03-02 DIAGNOSIS — J309 Allergic rhinitis, unspecified: Secondary | ICD-10-CM | POA: Diagnosis not present

## 2024-03-02 DIAGNOSIS — M810 Age-related osteoporosis without current pathological fracture: Secondary | ICD-10-CM | POA: Diagnosis not present

## 2024-03-02 DIAGNOSIS — Z79899 Other long term (current) drug therapy: Secondary | ICD-10-CM | POA: Diagnosis not present

## 2024-03-02 DIAGNOSIS — I4891 Unspecified atrial fibrillation: Secondary | ICD-10-CM | POA: Diagnosis not present

## 2024-03-02 DIAGNOSIS — R7303 Prediabetes: Secondary | ICD-10-CM | POA: Diagnosis not present

## 2024-03-02 DIAGNOSIS — G2581 Restless legs syndrome: Secondary | ICD-10-CM | POA: Diagnosis not present

## 2024-03-02 DIAGNOSIS — Z7901 Long term (current) use of anticoagulants: Secondary | ICD-10-CM | POA: Diagnosis not present

## 2024-03-20 ENCOUNTER — Other Ambulatory Visit: Payer: Self-pay | Admitting: Cardiology

## 2024-04-03 ENCOUNTER — Other Ambulatory Visit: Payer: Self-pay | Admitting: Cardiology

## 2024-04-03 DIAGNOSIS — I480 Paroxysmal atrial fibrillation: Secondary | ICD-10-CM

## 2024-04-04 NOTE — Telephone Encounter (Signed)
 Prescription refill request for Xarelto  received.  Indication:afib Last office visit:3/25 Weight:66.2  kg Age:83 Scr:0.85  3/25 CrCl:52.41  ml/min  Prescription refilled

## 2024-05-22 ENCOUNTER — Ambulatory Visit (HOSPITAL_COMMUNITY)
Admission: RE | Admit: 2024-05-22 | Discharge: 2024-05-22 | Disposition: A | Discharge status: Home / Self Care | Source: Ambulatory Visit | Attending: Family Medicine | Admitting: Family Medicine

## 2024-05-22 ENCOUNTER — Other Ambulatory Visit (HOSPITAL_COMMUNITY): Payer: Self-pay | Admitting: Family Medicine

## 2024-05-22 DIAGNOSIS — Z87891 Personal history of nicotine dependence: Secondary | ICD-10-CM | POA: Diagnosis present

## 2024-05-24 ENCOUNTER — Other Ambulatory Visit (HOSPITAL_COMMUNITY): Payer: Self-pay | Admitting: Family Medicine

## 2024-05-24 DIAGNOSIS — Z87891 Personal history of nicotine dependence: Secondary | ICD-10-CM

## 2024-06-22 ENCOUNTER — Encounter: Payer: Self-pay | Admitting: Internal Medicine

## 2024-06-22 ENCOUNTER — Ambulatory Visit: Admitting: Internal Medicine

## 2024-06-22 VITALS — BP 108/74 | HR 127 | Ht 66.0 in | Wt 151.6 lb

## 2024-06-22 DIAGNOSIS — J4489 Other specified chronic obstructive pulmonary disease: Secondary | ICD-10-CM | POA: Insufficient documentation

## 2024-06-22 DIAGNOSIS — K089 Disorder of teeth and supporting structures, unspecified: Secondary | ICD-10-CM

## 2024-06-22 DIAGNOSIS — G300 Alzheimer's disease with early onset: Secondary | ICD-10-CM | POA: Diagnosis not present

## 2024-06-22 DIAGNOSIS — F0280 Dementia in other diseases classified elsewhere without behavioral disturbance: Secondary | ICD-10-CM | POA: Diagnosis not present

## 2024-06-22 MED ORDER — ALBUTEROL SULFATE (2.5 MG/3ML) 0.083% IN NEBU: 2.5000 mg | INHALATION_SOLUTION | RESPIRATORY_TRACT | 12 refills | Status: AC | PRN

## 2024-06-22 MED ORDER — METHYLPREDNISOLONE ACETATE 80 MG/ML IJ SUSP
120.0000 mg | Freq: Once | INTRAMUSCULAR | Status: AC
  Administered 2024-06-22: 10:00:00 120 mg via INTRAMUSCULAR

## 2024-06-22 MED ORDER — BUDESONIDE 0.25 MG/2ML IN SUSP: RESPIRATORY_TRACT | 12 refills | Status: AC

## 2024-06-22 MED ORDER — AMOXICILLIN-POT CLAVULANATE 875-125 MG PO TABS: 1.0000 | ORAL_TABLET | Freq: Two times a day (BID) | ORAL | 0 refills | Status: DC

## 2024-06-22 NOTE — Patient Instructions (Addendum)
 Depomedrol 120 mg IM   Nebulizer albuterol  2.5 mg combine with budesonide  0.25 mg twice daily   If needed can add albuterol  by itself in between the automatic treatments  Augmentin  875 mg take one pill twice daily  X 10 days - take at breakfast and supper with large glass of water.  It would help reduce the usual side effects (diarrhea and yeast infections) if you ate cultured yogurt at lunch.   For cough/ congestion > mucinex or mucinex dm  up to maximum of  1200 mg every 12 hours and use your flutter valve as much as you can    Strongly recommend you see a dentist   Please schedule a follow up office visit in 4 weeks, sooner if needed  with all medications /inhalers/ solutions/flutter valve in hand so we can verify exactly what you are taking. This includes all medications from all doctors and over the counters

## 2024-06-22 NOTE — Progress Notes (Unsigned)
 Patricia Clark, female    DOB: 01-31-1941    MRN: [id number]   Brief patient profile:  17  yowf  with RA quit smoking 2017 with intermittent cough   referred to pulmonary clinic in [place]  06/22/2024 by Dr Frederik  for cough worse since Sept 2025 pattern typical of her pattern but also some springtime    Pt not previously seen by [facility] service.     History of Present Illness  06/22/2024  Pulmonary/ 1st office eval/ Patricia Clark / [place] Office  Chief Complaint  Patient presents with   Establish Care    Congestion - coughing (yellow) - shob  Former smoker    Dyspnea:  using rollator  vs baseline able to do grocery shopping  Cough: brown mucus worse in am  Sleep: bed is 30 degrees/ one pillow  SABA use: nebulizer twice daily x one week   02: *** LDSCT:***  No obvious day to day or daytime pattern/variability or assoc  or mucus plugs or hemoptysis or cp or chest tightness, subjective wheeze or overt sinus or hb symptoms.    Also denies any obvious fluctuation of symptoms with weather or environmental changes or other aggravating or alleviating factors except as outlined above   No unusual exposure hx or h/o childhood pna/ asthma or knowledge of premature birth.  Current Allergies, Complete Past Medical History, Past Surgical History, Family History, and Social History were reviewed in [employer] record.  ROS  The following are not active complaints unless bolded Hoarseness, sore throat, dysphagia, dental problems, itching, sneezing,  nasal congestion or discharge of excess mucus or purulent secretions, ear ache,   fever, chills, sweats, unintended wt loss or wt gain, classically pleuritic or exertional cp,  orthopnea pnd or arm/hand swelling  or leg swelling, presyncope, palpitations, abdominal pain, anorexia, nausea, vomiting, diarrhea  or change in bowel habits or change in bladder habits, change in stools or change in urine, dysuria, hematuria,  rash,  arthralgias, visual complaints, headache, numbness, weakness or ataxia or problems with walking or coordination,  change in mood or  memory.            Outpatient Medications Prior to Visit  Medication Sig Dispense Refill   alendronate (FOSAMAX) 70 MG tablet Take 70 mg by mouth once a week. Tuesday     Ascorbic Acid (VITAMIN C) 500 MG CAPS Take 500 mg by mouth daily.     Cholecalciferol 1.25 MG (50000 UT) capsule Take 1 capsule by mouth once a week. Thursday     donepezil (ARICEPT) 10 MG tablet Take 10 mg by mouth at bedtime.     ENBREL SURECLICK 50 MG/ML injection Inject 50 mg into the skin once a week.     folic acid  (FOLVITE ) 1 MG tablet Take 1 mg by mouth daily.     hydroxychloroquine (PLAQUENIL) 200 MG tablet Take 200 mg by mouth 2 (two) times daily.     memantine (NAMENDA) 5 MG tablet Take 5 mg by mouth daily.     metoprolol  succinate (TOPROL -XL) 50 MG 24 hr tablet TAKE 1 TABLET(50 MG) BY MOUTH DAILY WITH OR IMMEDIATELY FOLLOWING A MEAL 90 tablet 1   Potassium Chloride  ER 20 MEQ TBCR Take 1 tablet by mouth daily.     pramipexole (MIRAPEX) 0.125 MG tablet Take 0.125 mg by mouth 2 (two) times daily.      QUEtiapine (SEROQUEL) 25 MG tablet Take 25 mg by mouth at bedtime.     simvastatin  (ZOCOR )  20 MG tablet TAKE 1 TABLET BY MOUTH EVERY DAY (Patient taking differently: Take 20 mg by mouth daily.) 90 tablet 3   spironolactone  (ALDACTONE ) 25 MG tablet TAKE 1 TABLET BY MOUTH EVERY DAY (Patient taking differently: Take 25 mg by mouth daily.) 90 tablet 3   XARELTO  20 MG TABS tablet TAKE 1 TABLET BY MOUTH IN THE EVENING 90 tablet 11   azithromycin (ZITHROMAX) 250 MG tablet Take 250-500 mg by mouth See admin instructions. Take two tablets by mouth on the first day of course. For the following four days take one tablet by mouth in the morning. (Patient not taking: Reported on 06/22/2024)     benzonatate  (TESSALON ) 100 MG capsule Take 1 capsule (100 mg total) by mouth every 8 (eight) hours. (Patient  not taking: Reported on 06/22/2024) 21 capsule 0   oseltamivir (TAMIFLU) 75 MG capsule Take 75 mg by mouth 2 (two) times daily. (Patient not taking: Reported on 06/22/2024)     No facility-administered medications prior to visit.    Past Medical History:  Diagnosis Date   A-fib (HCC)    Alzheimer dementia (HCC)    Hyperlipidemia    Hypertension    Osteoarthritis    Rheumatoid arthritis (HCC)    Wrist fracture       Objective:     BP 108/74   Pulse (!) 127   Ht 5' 6 (1.676 m)   Wt 151 lb 9.6 oz (68.8 kg)   SpO2 100% Comment: ra  BMI 24.47 kg/m   SpO2: 100 % (ra) eldelry wf /rollator / no upper teeth/ very poor lower and junky rhonchi      Assessment

## 2024-06-24 DIAGNOSIS — K089 Disorder of teeth and supporting structures, unspecified: Secondary | ICD-10-CM | POA: Insufficient documentation

## 2024-06-24 NOTE — Assessment & Plan Note (Addendum)
 Strongly rec dental eval asap given cognitive decline and risk of noct aspiration  F/u in 4 weeks with all meds in hand using a trust but verify approach to confirm accurate Medication  Reconciliation The principal here is that until we are certain that the  patients are doing what we've asked, it makes no sense to ask them to do more.          Each maintenance medication was reviewed in detail including emphasizing most importantly the difference between maintenance and prns and under what circumstances the prns are to be triggered using an action plan format where appropriate.  Total time for H and P, chart review, counseling, reviewing neb/ flutter device(s) and generating customized AVS unique to this office visit / same day charting = 37 min new pt eval

## 2024-06-24 NOTE — Assessment & Plan Note (Addendum)
 Quit smoking  2017 with overall health decline related to RA and dementia  - 06/22/2024 rx alb/bud bid neb and f/u in 6 weeks p rx purulent bronchitis as follows  >>> augmentin  x 10 days  >>>  .mucinex with flutter valve   >>>   depomedrol 120 mg IM    in case of component of Th-2 driven upper or lower airways inflammation (if cough responds short term only to relapse before return while will on full rx for uacs (as above), then that would point to allergic rhinitis/ asthma or eos bronchitis as alternative dx)

## 2024-07-26 ENCOUNTER — Ambulatory Visit: Admitting: Internal Medicine

## 2024-07-26 ENCOUNTER — Encounter: Payer: Self-pay | Admitting: Internal Medicine

## 2024-07-26 VITALS — BP 111/80 | HR 123 | Ht 66.0 in | Wt 145.0 lb

## 2024-07-26 DIAGNOSIS — I482 Chronic atrial fibrillation, unspecified: Secondary | ICD-10-CM

## 2024-07-26 DIAGNOSIS — J310 Chronic rhinitis: Secondary | ICD-10-CM | POA: Insufficient documentation

## 2024-07-26 DIAGNOSIS — Z87891 Personal history of nicotine dependence: Secondary | ICD-10-CM

## 2024-07-26 DIAGNOSIS — J4489 Other specified chronic obstructive pulmonary disease: Secondary | ICD-10-CM | POA: Diagnosis not present

## 2024-07-26 DIAGNOSIS — G300 Alzheimer's disease with early onset: Secondary | ICD-10-CM

## 2024-07-26 NOTE — Patient Instructions (Addendum)
 Ok to take metaprolol 50 mg one extra daily (same drug as [place]sp[place]  50)  when pulse is over a 100 at rest as it is today  until you see your PCP (Dr Shona) for further adjustments  Albuterol  can be reduced to just one half vial with budesonide  0.25 mg  up to every 4 hours if needed  so no need to take it automatically any more, just use as needed for cough or breathing problems   My office will be contacting you by phone for referral to [facility]   - if you don't hear back from my office within one week please call us  back or notify us  thru MyChart and we'll address it right away.   Please schedule a follow up visit in 3 months but call sooner if needed - bring nebulizer solution x one vial

## 2024-07-26 NOTE — Progress Notes (Unsigned)
 "   Patricia Clark, female    DOB: 1941-06-19    MRN: [id number]   Brief patient profile:  83  yowf from [place]   with RA quit smoking 2017 with intermittent cough   referred to pulmonary clinic in [facility] [address]  06/22/2024 by Rexene Brought NP   for cough worse since Sept 2025 pattern typical for her but also has  some springtime flares    History of Present Illness  06/22/2024  Pulmonary/ 1st office eval/ Christ[facility] / [facility]  Chief Complaint  Patient presents with   Establish Care    Congestion - coughing (yellow) - shob  Former smoker    Dyspnea:  using rollator   baseline still  able to do grocery shopping  Cough: brown mucus worse in am  Sleep: bed is 30 degrees/ one pillow  SABA use: nebulizer twice daily x one week   02: none  Patient Instructions  Depomedrol 120 mg IM  Nebulizer albuterol  2.5 mg combine with budesonide  0.25 mg twice daily  If needed can add albuterol  by itself in between the automatic treatments Augmentin  875 mg take one pill twice daily  X 10 days  For cough/ congestion > mucinex or mucinex dm  up to maximum of  1200 mg every 12 hours and use your flutter valve as much as you can   Strongly recommend you see a dentist  Please schedule a follow up office visit in 4 weeks, sooner if needed  with all medications /inhalers/ solutions/flutter valve in hand   07/26/2024  f/u [facility]/Carola Viramontes re: RA/AB/ poor dentition  maint on alb/bud bid   did  bring meds  except for neb solution / inhalers Chief Complaint  Patient presents with   Cough    Shob Symtoms overall are better   Dyspnea:  improved and  more limited by balance than breathing/ [employer] leaning on cart ok Cough: resolved  Sleeping: 30 degrees hob/ 1 pillow s  resp cc  SABA use: no extra rx  02: none  No obvious day to day or daytime variability or assoc excess/ purulent sputum or mucus plugs or hemoptysis or cp or chest tightness, subjective wheeze or overt sinus or hb symptoms.    Also denies any  obvious fluctuation of symptoms with weather or environmental changes or other aggravating or alleviating factors except as outlined above   No unusual exposure hx or h/o childhood pna/ asthma or knowledge of premature birth.  Current Allergies, Complete Past Medical History, Past Surgical History, Family History, and Social History were reviewed in [employer] record.  ROS  The following are not active complaints unless bolded Hoarseness, sore throat, dysphagia, dental problems, itching, sneezing,  nasal congestion or discharge of excess mucus or purulent secretions, ear ache,   fever, chills, sweats, unintended wt loss or wt gain, classically pleuritic or exertional cp,  orthopnea pnd or arm/hand swelling  or leg swelling, presyncope, palpitations, abdominal pain, anorexia, nausea, vomiting, diarrhea  or change in bowel habits or change in bladder habits, change in stools or change in urine, dysuria, hematuria,  rash, arthralgias, visual complaints, headache, numbness, weakness or ataxia or problems with walking or coordination,  change in mood or  memory.         Outpatient Medications Prior to Visit  Medication Sig Dispense Refill   albuterol  (PROVENTIL ) (2.5 MG/3ML) 0.083% nebulizer solution Take 3 mLs (2.5 mg total) by nebulization every 4 (four) hours as needed. 75 mL 12   alendronate (  FOSAMAX) 70 MG tablet Take 70 mg by mouth once a week. Tuesday     Ascorbic Acid (VITAMIN C) 500 MG CAPS Take 500 mg by mouth daily.     benzonatate  (TESSALON ) 100 MG capsule Take 1 capsule (100 mg total) by mouth every 8 (eight) hours. 21 capsule 0   budesonide  (PULMICORT ) 0.25 MG/2ML nebulizer solution Combine with albuterol  in nebulizer twice daily 120 mL 12   Cholecalciferol 1.25 MG (50000 UT) capsule Take 1 capsule by mouth once a week. Thursday     donepezil (ARICEPT) 10 MG tablet Take 10 mg by mouth at bedtime.     ENBREL SURECLICK 50 MG/ML injection Inject 50 mg into the skin  once a week.     folic acid  (FOLVITE ) 1 MG tablet Take 1 mg by mouth daily.     hydroxychloroquine (PLAQUENIL) 200 MG tablet Take 200 mg by mouth 2 (two) times daily.     memantine (NAMENDA) 5 MG tablet Take 5 mg by mouth daily.     metoprolol  succinate (TOPROL -XL) 50 MG 24 hr tablet TAKE 1 TABLET(50 MG) BY MOUTH DAILY WITH OR IMMEDIATELY FOLLOWING A MEAL 90 tablet 1   Potassium Chloride  ER 20 MEQ TBCR Take 1 tablet by mouth daily.     pramipexole (MIRAPEX) 0.125 MG tablet Take 0.125 mg by mouth 2 (two) times daily.      QUEtiapine (SEROQUEL) 25 MG tablet Take 25 mg by mouth at bedtime.     simvastatin  (ZOCOR ) 20 MG tablet TAKE 1 TABLET BY MOUTH EVERY DAY 90 tablet 3   spironolactone  (ALDACTONE ) 25 MG tablet TAKE 1 TABLET BY MOUTH EVERY DAY (Patient taking differently: Take 25 mg by mouth daily.) 90 tablet 3   XARELTO  20 MG TABS tablet TAKE 1 TABLET BY MOUTH IN THE EVENING 90 tablet 11   amoxicillin -clavulanate (AUGMENTIN ) 875-125 MG tablet Take 1 tablet by mouth 2 (two) times daily. 20 tablet 0   azithromycin (ZITHROMAX) 250 MG tablet Take 250-500 mg by mouth See admin instructions. Take two tablets by mouth on the first day of course. For the following four days take one tablet by mouth in the morning. (Patient not taking: Reported on 06/22/2024)     oseltamivir (TAMIFLU) 75 MG capsule Take 75 mg by mouth 2 (two) times daily. (Patient not taking: Reported on 07/26/2024)     No facility-administered medications prior to visit.         Past Medical History:  Diagnosis Date   A-fib (HCC)    Alzheimer dementia (HCC)    Hyperlipidemia    Hypertension    Osteoarthritis    Rheumatoid arthritis (HCC)    Wrist fracture       Objective:    wts  07/26/2024       145   06/22/24 151 lb 9.6 oz (68.8 kg)  09/17/23 146 lb (66.2 kg)  09/16/23 132 lb (59.9 kg)      Vital signs reviewed  07/26/2024  - Note at rest 02 sats  98% on RA   General appearance:    pleasant amb wf slt awkward gait  4 lets her grand daughter do the talking       HEENT : Oropharynx  clear/ poor lower dentition   N    NECK :  without  apparent JVD/ palpable Nodes/TM    LUNGS: no acc muscle use,  Min barrel  contour chest wall with bilateral  slightly decreased bs s audible wheeze and  without cough on  insp or exp maneuvers and min  Hyperresonant  to  percussion bilaterally    CV: IRIR apical rate 120   no s3 or murmur or increase in P2, and  chronic pitting edema both LEs  for years (no change baseline per daughter)   ABD:  soft and nontender    MS:  Nl gait/ ext warm without deformities Or obvious joint restrictions  calf tenderness, cyanosis or clubbing     SKIN: warm and dry without lesions    NEURO:  alert, approp, nl sensorium with  no motor or cerebellar deficits apparent.                Assessment   Assessment & Plan Asthmatic bronchitis , chronic (HCC) Quit smoking  2017 - 06/22/2024 rx alb/bud bid neb and f/u in 6 weeks p rx purulent bronchitis with augmetin   Improved vs prior eval but not sure exactly what she's taking at home and whether contributing now to rapid afib so rec  >>> only use albuterol  if needed and when do so try just one half dose up to q 4h prn with budesonide  0.25 mg the equivaltent of air supra   >>> refer to ENT for rhinitis and ? Recurrent sinsuitis as the source of the AB   >>> ENT eval for the poor dentition/ risk of aspiration / lung infection    Chronic atrial fibrillation (HCC) Rec double the dose of metaprolol and use half dose albuterol  and f/u with PCP planned this week  F/u 3 m, sooner pnr          Each maintenance medication was reviewed in detail including emphasizing most importantly the difference between maintenance and prns and under what circumstances the prns are to be triggered using an action plan format where appropriate.  Total time for H and P, chart review, counseling, reviewing neb  device(s) and generating customized AVS  unique to this office visit / same day charting = 35 min          AVS  Patient Instructions  Ok to take metaprolol 50 mg one extra daily (same drug as [place]  50)  when pulse is over a 100 at rest as it is today  until you see your PCP (Dr Shona) for further adjustments  Albuterol  can be reduced to just one half vial with budesonide  0.25 mg  up to every 4 hours if needed  so no need to take it automatically any more, just use as needed for cough or breathing problems   My office will be contacting you by phone for referral to [facility]   - if you don't hear back from my office within one week please call us  back or notify us  thru MyChart and we'll address it right away.   Please schedule a follow up visit in 3 months but call sooner if needed - bring nebulizer solution x one vial      Ozell America, MD 07/27/2024            "

## 2024-07-27 DIAGNOSIS — I482 Chronic atrial fibrillation, unspecified: Secondary | ICD-10-CM | POA: Insufficient documentation

## 2024-07-27 NOTE — Assessment & Plan Note (Addendum)
 Quit smoking  2017 - 06/22/2024 rx alb/bud bid neb and f/u in 6 weeks p rx purulent bronchitis with augmetin   Improved vs prior eval but not sure exactly what she's taking at home and whether contributing now to rapid afib so rec  >>> only use albuterol  if needed and when do so try just one half dose up to q 4h prn with budesonide  0.25 mg the equivaltent of air supra   >>> refer to ENT for rhinitis and ? 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

## 2024-07-27 NOTE — Assessment & Plan Note (Addendum)
 Rec double the dose of metaprolol and use half dose albuterol  and f/u with PCP planned this week  F/u 3 m, sooner pnr          Each maintenance medication was reviewed in detail including emphasizing most importantly the difference between maintenance and prns and under what circumstances the prns are to be triggered using an action plan format where appropriate.  Total time for H and P, chart review, counseling, reviewing neb  device(s) and generating customized AVS unique to this office visit / same day charting = 35 min

## 2024-08-02 ENCOUNTER — Encounter (INDEPENDENT_AMBULATORY_CARE_PROVIDER_SITE_OTHER): Payer: Self-pay

## 2024-10-24 ENCOUNTER — Ambulatory Visit: Admitting: Internal Medicine
